# Patient Record
Sex: Female | Born: 1950
Health system: Southern US, Community
[De-identification: ages and names within clinical notes are randomized; demographics above are authoritative.]

## PROBLEM LIST (undated history)

## (undated) DIAGNOSIS — I10 Essential (primary) hypertension: Secondary | ICD-10-CM

## (undated) DIAGNOSIS — E785 Hyperlipidemia, unspecified: Secondary | ICD-10-CM

## (undated) DIAGNOSIS — E0500 Thyrotoxicosis with diffuse goiter without thyrotoxic crisis or storm: Secondary | ICD-10-CM

## (undated) HISTORY — DX: Thyrotoxicosis with diffuse goiter without thyrotoxic crisis or storm: E05.00

## (undated) HISTORY — PX: OTHER SURGICAL HISTORY: SHX169

## (undated) HISTORY — PX: BREAST BIOPSY: SHX20

## (undated) HISTORY — DX: Essential (primary) hypertension: I10

## (undated) HISTORY — DX: Hyperlipidemia, unspecified: E78.5

---

## 2000-12-30 ENCOUNTER — Encounter: Payer: Self-pay | Admitting: Internal Medicine

## 2004-02-16 ENCOUNTER — Encounter: Payer: Self-pay | Admitting: Internal Medicine

## 2004-08-04 ENCOUNTER — Ambulatory Visit: Payer: Self-pay | Admitting: Internal Medicine

## 2004-09-15 ENCOUNTER — Ambulatory Visit: Payer: Self-pay | Admitting: Internal Medicine

## 2005-02-05 ENCOUNTER — Ambulatory Visit: Payer: Self-pay | Admitting: Internal Medicine

## 2005-05-11 ENCOUNTER — Ambulatory Visit: Payer: Self-pay | Admitting: Internal Medicine

## 2005-11-23 ENCOUNTER — Ambulatory Visit: Payer: Self-pay | Admitting: Internal Medicine

## 2006-01-10 ENCOUNTER — Ambulatory Visit: Payer: Self-pay | Admitting: Gastroenterology

## 2007-03-18 ENCOUNTER — Telehealth (INDEPENDENT_AMBULATORY_CARE_PROVIDER_SITE_OTHER): Payer: Self-pay | Admitting: *Deleted

## 2007-06-09 ENCOUNTER — Telehealth (INDEPENDENT_AMBULATORY_CARE_PROVIDER_SITE_OTHER): Payer: Self-pay | Admitting: *Deleted

## 2007-11-21 ENCOUNTER — Ambulatory Visit: Payer: Self-pay | Admitting: Internal Medicine

## 2007-11-21 DIAGNOSIS — E7849 Other hyperlipidemia: Secondary | ICD-10-CM | POA: Insufficient documentation

## 2007-11-21 DIAGNOSIS — I10 Essential (primary) hypertension: Secondary | ICD-10-CM

## 2007-11-21 DIAGNOSIS — E785 Hyperlipidemia, unspecified: Secondary | ICD-10-CM

## 2007-11-26 LAB — CONVERTED CEMR LAB
Albumin: 4.2 g/dL (ref 3.5–5.2)
Basophils Absolute: 0 10*3/uL (ref 0.0–0.1)
Basophils Relative: 0.8 % (ref 0.0–3.0)
Bilirubin, Direct: 0.1 mg/dL (ref 0.0–0.3)
CO2: 30 meq/L (ref 19–32)
Chloride: 102 meq/L (ref 96–112)
Cholesterol: 209 mg/dL (ref 0–200)
Eosinophils Absolute: 0.1 10*3/uL (ref 0.0–0.7)
Eosinophils Relative: 2.2 % (ref 0.0–5.0)
GFR calc Af Amer: 95 mL/min
GFR calc non Af Amer: 79 mL/min
HCT: 39.8 % (ref 36.0–46.0)
HDL: 52.5 mg/dL (ref >39.0)
MCHC: 32.7 g/dL (ref 30.0–36.0)
MCV: 81.8 fL (ref 78.0–100.0)
Microalb Creat Ratio: 6 mg/g (ref 0.0–30.0)
Microalb, Ur: 0.2 mg/dL (ref 0.0–1.9)
Monocytes Absolute: 0.3 10*3/uL (ref 0.1–1.0)
Neutrophils Relative %: 44.4 % (ref 43.0–77.0)
Platelets: 183 10*3/uL (ref 150–400)
Potassium: 3.7 meq/L (ref 3.5–5.1)
TSH: 0.94 microintl units/mL (ref 0.35–5.50)
Total Protein: 7.3 g/dL (ref 6.0–8.3)
VLDL: 13 mg/dL (ref 0–40)
WBC: 4.5 10*3/uL (ref 4.5–10.5)

## 2007-12-25 ENCOUNTER — Ambulatory Visit: Payer: Self-pay | Admitting: Internal Medicine

## 2008-01-29 ENCOUNTER — Encounter: Payer: Self-pay | Admitting: Internal Medicine

## 2008-04-09 ENCOUNTER — Encounter: Payer: Self-pay | Admitting: Internal Medicine

## 2008-06-21 ENCOUNTER — Ambulatory Visit: Payer: Self-pay | Admitting: Internal Medicine

## 2008-08-05 ENCOUNTER — Ambulatory Visit: Payer: Self-pay | Admitting: Internal Medicine

## 2008-09-06 ENCOUNTER — Encounter: Payer: Self-pay | Admitting: Internal Medicine

## 2008-09-06 ENCOUNTER — Ambulatory Visit: Payer: Self-pay | Admitting: Internal Medicine

## 2008-09-06 LAB — HM COLONOSCOPY: HM Colonoscopy: 2

## 2008-09-12 ENCOUNTER — Encounter: Payer: Self-pay | Admitting: Internal Medicine

## 2009-07-20 ENCOUNTER — Telehealth: Payer: Self-pay | Admitting: Internal Medicine

## 2009-08-08 ENCOUNTER — Ambulatory Visit: Payer: Self-pay | Admitting: Internal Medicine

## 2009-08-08 DIAGNOSIS — E0500 Thyrotoxicosis with diffuse goiter without thyrotoxic crisis or storm: Secondary | ICD-10-CM

## 2009-08-09 LAB — CONVERTED CEMR LAB
ALT: 13 units/L (ref 0–35)
AST: 17 units/L (ref 0–37)
Alkaline Phosphatase: 37 units/L — ABNORMAL LOW (ref 39–117)
BUN: 8 mg/dL (ref 6–23)
Basophils Absolute: 0 10*3/uL (ref 0.0–0.1)
Bilirubin, Direct: 0.1 mg/dL (ref 0.0–0.3)
Calcium: 9.2 mg/dL (ref 8.4–10.5)
Creatinine, Ser: 0.8 mg/dL (ref 0.4–1.2)
Direct LDL: 146.4 mg/dL
Eosinophils Absolute: 0.1 10*3/uL (ref 0.0–0.7)
Glucose, Bld: 92 mg/dL (ref 70–99)
Hemoglobin: 13.5 g/dL (ref 12.0–15.0)
Lymphocytes Relative: 39.5 % (ref 12.0–46.0)
MCHC: 32.9 g/dL (ref 30.0–36.0)
Neutro Abs: 2.3 10*3/uL (ref 1.4–7.7)
Phosphorus: 4 mg/dL (ref 2.3–4.6)
Platelets: 200 10*3/uL (ref 150.0–400.0)
Potassium: 3.6 meq/L (ref 3.5–5.1)
RDW: 15.3 % — ABNORMAL HIGH (ref 11.5–14.6)
TSH: 0.72 microintl units/mL (ref 0.35–5.50)
Total Bilirubin: 0.4 mg/dL (ref 0.3–1.2)
Total Protein: 7.3 g/dL (ref 6.0–8.3)

## 2009-09-09 ENCOUNTER — Encounter (INDEPENDENT_AMBULATORY_CARE_PROVIDER_SITE_OTHER): Payer: Self-pay | Admitting: *Deleted

## 2010-06-06 ENCOUNTER — Ambulatory Visit
Admission: RE | Admit: 2010-06-06 | Discharge: 2010-06-06 | Payer: Self-pay | Source: Home / Self Care | Attending: Internal Medicine | Admitting: Internal Medicine

## 2010-06-06 ENCOUNTER — Other Ambulatory Visit: Payer: Self-pay | Admitting: Internal Medicine

## 2010-06-06 LAB — RENAL FUNCTION PANEL
Albumin: 3.9 g/dL (ref 3.5–5.2)
BUN: 16 mg/dL (ref 6–23)
CO2: 30 mEq/L (ref 19–32)
Chloride: 106 mEq/L (ref 96–112)

## 2010-06-06 LAB — CBC WITH DIFFERENTIAL/PLATELET
Basophils Absolute: 0 10*3/uL (ref 0.0–0.1)
Eosinophils Absolute: 0.1 10*3/uL (ref 0.0–0.7)
HCT: 39.4 % (ref 36.0–46.0)
Hemoglobin: 12.7 g/dL (ref 12.0–15.0)
Lymphs Abs: 2 10*3/uL (ref 0.7–4.0)
MCHC: 32.3 g/dL (ref 30.0–36.0)
Neutro Abs: 2.8 10*3/uL (ref 1.4–7.7)
RDW: 15.6 % — ABNORMAL HIGH (ref 11.5–14.6)

## 2010-06-06 LAB — HEPATIC FUNCTION PANEL
Alkaline Phosphatase: 36 U/L — ABNORMAL LOW (ref 39–117)
Bilirubin, Direct: 0 mg/dL (ref 0.0–0.3)
Total Protein: 7.3 g/dL (ref 6.0–8.3)

## 2010-06-06 LAB — LIPID PANEL
Total CHOL/HDL Ratio: 3
VLDL: 9.8 mg/dL (ref 0.0–40.0)

## 2010-06-06 NOTE — Assessment & Plan Note (Signed)
Summary: MED REFILL/DS   Vital Signs:  Patient profile:   60 year old female Weight:      192 pounds BMI:     31.10 Temp:     98.7 degrees F oral Pulse rate:   60 / minute Pulse rhythm:   regular BP sitting:   150 / 92  (left arm) Cuff size:   regular  Vitals Entered By: Mervin Hack CMA Duncan Dull) (August 08, 2009 10:10 AM) CC: med refill, Hypertension Management   History of Present Illness: Doing fine No new concerns  Occ checks BP in [facility] Usually 140-150/70-80 Occ headaches---usually mild and BC relieves No chest pain No SOB Occ edema---generally as the day goes on. Not bothersome  Variable with diet No sig exercise No OTC meds for cholesterol  No rash No skin changes No hair changes  Hypertension History:      Positive major cardiovascular risk factors include female age 29 years old or older, hyperlipidemia, and hypertension.  Negative major cardiovascular risk factors include non-tobacco-user status.     Allergies: No Known Drug Allergies  Past History:  Past medical, surgical, family and social histories (including risk factors) reviewed for relevance to current acute and chronic problems.  Past Medical History: Hyperlipidemia Hypertension Rudnick disease ---  Rx with RAI  Past Surgical History: Reviewed history from 11/21/2007 and no changes required. VD x 2 Hysterectomy for fibroids--1970s  Family History: Reviewed history from 11/21/2007 and no changes required. 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   Neck:  supple, no masses, no thyromegaly, no  carotid bruits, and no cervical lymphadenopathy.   Lungs:  normal respiratory effort and normal breath sounds.   Heart:  normal rate, regular rhythm, no murmur, and no gallop.   Abdomen:  soft and non-tender.   Extremities:  no sig edema Psych:  normally interactive, good eye contact, not anxious appearing, and not depressed appearing.     Impression & Recommendations:  Problem # 1:  HYPERTENSION (ICD-401.9) Assessment Unchanged  suboptimal control discussed that lifestyle measures could provide sig improvements will check labs will change to losartan for cost  The following medications were removed from the medication list:    Diovan Hct 160-12.5 Mg Tabs (Valsartan-hydrochlorothiazide) .Marland Kitchen... Take 1 tablet by mouth once a day Her updated medication list for this problem includes:    Ziac 10-6.25 Mg Tabs (Bisoprolol-hydrochlorothiazide) .Marland Kitchen... Take 1 tablet by mouth once a day    Amlodipine Besylate 5 Mg Tabs (Amlodipine besylate) .Marland Kitchen... Take 1 tablet by mouth once a day    Losartan Potassium-hctz 100-25 Mg Tabs (Losartan potassium-hctz) .Marland Kitchen... 1 tab daily for high blood pressure  BP today: 150/92 Prior BP: 148/100 (06/21/2008)  10 Yr Risk Heart Disease: 11 %  Labs Reviewed: K+: 3.7 (11/21/2007) Creat: : 0.8 (11/21/2007)   Chol: 209 (11/21/2007)   HDL: 52.5 (11/21/2007)   LDL: 143 (11/21/2007)   TG: 66 (11/21/2007)  Orders: TLB-Renal Function Panel (80069-RENAL) TLB-CBC Platelet - w/Differential (85025-CBCD) Venipuncture ([address])  Problem # 2:  Mak' DISEASE (ICD-242.00) Assessment: Unchanged  seems to be euthyroid  will check labs  Orders: TLB-TSH (Thyroid Stimulating Hormone) (84443-TSH) TLB-T4 (Thyrox), Free [phone])  Problem # 3:  HYPERLIPIDEMIA (ICD-272.4)  not overly high risk profile discussed lifestyle measures will recheck labs  Labs Reviewed: SGOT: 22 (11/21/2007)   SGPT: 18 (11/21/2007)  10 Yr Risk Heart Disease: 11 %   HDL:52.5 (11/21/2007)   LDL:143 (11/21/2007)  Chol:209 (11/21/2007)  Trig:66 (11/21/2007)  Orders: TLB-Lipid Panel (80061-LIPID) TLB-Hepatic/Liver Function Pnl (80076-HEPATIC)  Complete Medication List: 1)  Ziac 10-6.25 Mg Tabs (Bisoprolol-hydrochlorothiazide) .... Take 1 tablet by mouth once a day 2)  Amlodipine Besylate 5 Mg Tabs (Amlodipine besylate) .... Take 1 tablet by mouth once a day 3)  Losartan Potassium-hctz 100-25 Mg Tabs (Losartan potassium-hctz) .Marland Kitchen.. 1 tab daily for high blood pressure  Hypertension Assessment/Plan:      The patient's hypertensive risk group is category B: At least one risk factor (excluding diabetes) with no target organ damage.  Her calculated 10 year risk of coronary heart disease is 11 %.  Today's blood pressure is 150/92.    Patient Instructions: 1)  Please call [facility] to reschedule your mammogram 2)  Please schedule a follow-up appointment in 6 months for physical 3)  [facility]leas set up blood work (renal --401.9) in about 4-6 weeks Prescriptions: ZIAC 10-6.25 MG  TABS (BISOPROLOL-HYDROCHLOROTHIAZIDE) Take 1 tablet by mouth once a day  #30 x 11   Entered and Authorized by:   Cindee Salt MD   Signed by:   Cindee Salt MD on 08/08/2009   Method used:   [facility] to        [facility] [phone]* (retail)       [address] [place], [place]  [address]       Ph: [phone] or [phone]       Fax: [phone]   RxID:   [id number] AMLODIPINE BESYLATE 5 MG  TABS (AMLODIPINE BESYLATE) Take 1 tablet by mouth once a day  #30 x 11   Entered and Authorized by:   Cindee Salt MD   Signed by:   Cindee Salt MD on 08/08/2009   Method used:   Electronically to        [facility] [phone]* (retail)       [address] [place], [place]  [address]       Ph: [phone] or [phone]       Fax: [phone]   RxID:   [id number] LOSARTAN POTASSIUM-HCTZ 100-25 MG TABS  (LOSARTAN POTASSIUM-HCTZ) 1 tab daily for high blood pressure  #30 x 11   Entered and Authorized by:   Cindee Salt MD   Signed by:   Cindee Salt MD on 08/08/2009   Method used:   Electronically to        [facility] [phone]* (retail)       [address] [place], [place]  [address]       Ph: [phone] or [phone]       Fax: [phone]   RxID:   [id number]   Current Allergies (reviewed today): No known allergies

## 2010-06-06 NOTE — Progress Notes (Signed)
Summary: Needs appt.  Phone Note Outgoing Call   Call placed by: Mervin Hack CMA Duncan Dull),  July 20, 2009 5:12 PM Call placed to: Patient Summary of Call: called pt to advise that she needs to be seen for further refills, pt was last seen in 06/2008 and per last office note pt was to follow-up in 6 months, pt cancelled that appt. Pt made appt to be seen 08/01/2009 @ 11:15, will do refills to last until then. Initial call taken by: Mervin Hack CMA Duncan Dull),  July 20, 2009 5:14 PM  Follow-up for Phone Call        okay Follow-up by: Cindee Salt MD,  July 20, 2009 6:18 PM

## 2010-06-06 NOTE — Letter (Signed)
Summary: [place] No Show Letter  [place] at [facility]  [address] [place], [place] [address]   Phone: [phone]  Fax: [phone]    09/09/2009 MRN: [id number]  Kristine Hammond [address] [place], [place]  [address]   Dear [name],   Our records indicate that you missed your scheduled appointment with __lab___________________ on ___5.5.11_________.  Please contact this office to reschedule your appointment as soon as possible.  It is important that you keep your scheduled appointments with your physician, so we can provide you the best care possible.  Please be advised that there may be a charge for "no show" appointments.    Sincerely,   [place] at [facility]

## 2010-06-14 NOTE — Assessment & Plan Note (Signed)
Summary: CPX/DLO  r/s from 01/11/10   Vital Signs:  Patient profile:   60 year old female Weight:      196 pounds BMI:     31.75 Temp:     98.5 degrees F oral Pulse rate:   52 / minute Pulse rhythm:   regular BP sitting:   212 / 99  (left arm) Cuff size:   regular  Vitals Entered By: Mervin Hack CMA (AAMA) (June 06, 2010 9:22 AM)  Serial Vital Signs/Assessments:  Time      Position  BP       Pulse  Resp  Temp     By 9:24 AM             210/100  52                    DeShannon Smith CMA (AAMA)  CC: adult physcial   History of Present Illness: Has been doing fine  Discussed BP On unemployment and money problems Has been off her meds for about a week --due to finanacial reasons Now just filled them and is restarting---just took some on her way here Hasn't been checking BP No headaches  Feeling okay  Allergies: No Known Drug Allergies  Past History:  Past medical, surgical, family and social histories (including risk factors) reviewed for relevance to current acute and chronic problems.  Past Medical History: Reviewed history from 08/08/2009 and no changes required. Hyperlipidemia Hypertension Selvy disease ---  Rx with RAI  Past Surgical History: Reviewed history from 11/21/2007 and no changes required. VD x 2 Hysterectomy for fibroids--1970s  Family History: Reviewed history from 11/21/2007 and no changes required. Mom and Dad both died of stroke, dad @44 , mom @64  Brother with stroke also 9 siblings altogether HTN very strong in family Mat GF with bone cancer No breast or colon cancer  Social History: Reviewed history from 08/08/2009 and no changes required. 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 Eyes:  Denies double vision and vision loss-1  eye. ENT:  Denies decreased hearing and ringing in ears; teeth okay--regular with dentist. CV:  Denies chest pain or discomfort, difficulty breathing at night, difficulty breathing while lying down, fainting, lightheadness, palpitations, and shortness of breath with exertion. Resp:  Denies cough and shortness of breath. GI:  Denies abdominal pain, bloody stools, change in bowel habits, dark tarry stools, indigestion, nausea, and vomiting. GU:  Denies dysuria, incontinence, and urinary frequency; no sexual problems. MS:  Denies joint pain and joint swelling. Derm:  Denies lesion(s) and rash. Neuro:  Denies headaches, numbness, tingling, and weakness. Psych:  Denies anxiety and depression. Heme:  Denies abnormal bruising and enlarge lymph nodes. Allergy:  Denies seasonal allergies and sneezing.  Physical Exam  General:  alert and normal appearance.   Eyes:  pupils equal, pupils round, pupils reactive to light, and no optic disk abnormalities.   Ears:  R ear normal and L ear normal.   Mouth:  no erythema, no exudates, and no lesions.   Neck:  supple, no masses, no thyromegaly, no carotid bruits, and no cervical lymphadenopathy.   Breasts:  no abnormal thickening, no tenderness, and no adenopathy.  Mild cystic tissue Lungs:  normal respiratory effort, no intercostal retractions, no accessory muscle use, and normal breath sounds.   Heart:  normal rate, regular rhythm, no murmur, and no gallop.   Abdomen:  soft, non-tender, and no masses.   Msk:  no joint tenderness and no joint swelling.   Pulses:  1+ in feet Extremities:  no sig edema Neurologic:  alert & oriented X3, strength normal in all extremities, and gait normal.   Skin:  no rashes and no suspicious lesions.   Psych:  normally interactive, good eye contact, not anxious appearing, and not depressed appearing.     Impression & Recommendations:  Problem # 1:  PREVENTIVE HEALTH CARE (ICD-V70.0) Assessment Comment Only healthy but  out of shape probably not returning to work force discussed regular exercise like walkikng will check mammo  Problem # 2:  HYPERTENSION (ICD-401.9) Assessment: Deteriorated  has been off meds and only just took doses again in past hour or so will have her check as outpatient as her control had been okay no changes  Her updated medication list for this problem includes:    Ziac 10-6.25 Mg Tabs (Bisoprolol-hydrochlorothiazide) .Marland Kitchen... Take 1 tablet by mouth once a day    Amlodipine Besylate 5 Mg Tabs (Amlodipine besylate) .Marland Kitchen... Take 1 tablet by mouth once a day    Losartan Potassium-hctz 100-25 Mg Tabs (Losartan potassium-hctz) .Marland Kitchen... 1 tab daily for high blood pressure  BP today: 212/99 Prior BP: 150/92 (08/08/2009)  Prior 10 Yr Risk Heart Disease: 11 % (08/08/2009)  Labs Reviewed: K+: 3.6 (08/08/2009) Creat: : 0.8 (08/08/2009)   Chol: 216 (08/08/2009)   HDL: 60.20 (08/08/2009)   LDL: 143 (11/21/2007)   TG: 60.0 (08/08/2009)  Orders: TLB-Renal Function Panel (80069-RENAL) TLB-CBC Platelet - w/Differential (85025-CBCD) TLB-TSH (Thyroid Stimulating Hormone) (84443-TSH) Venipuncture ([address])  Problem # 3:  Baysinger' DISEASE (ICD-242.00) Assessment: Unchanged will recheck labs  Labs Reviewed: TSH: 0.72 (08/08/2009)     Problem # 4:  HYPERLIPIDEMIA (ICD-272.4) Assessment: Unchanged  mild elevation  will recheck but plan just lifestyle work for now  Labs Reviewed: SGOT: 17 (08/08/2009)   SGPT: 13 (08/08/2009)  Prior 10 Yr Risk Heart Disease: 11 % (08/08/2009)   HDL:60.20 (08/08/2009), 52.5 (11/21/2007)  LDL:143 (11/21/2007)  Chol:216 (08/08/2009), 209 (11/21/2007)  Trig:60.0 (08/08/2009), 66 (11/21/2007)  Orders: TLB-Lipid Panel (80061-LIPID) TLB-Hepatic/Liver Function Pnl (80076-HEPATIC)  Complete Medication List: 1)  Ziac 10-6.25 Mg Tabs (Bisoprolol-hydrochlorothiazide) .... Take 1 tablet by mouth once a day 2)  Amlodipine Besylate 5 Mg Tabs (Amlodipine besylate) ....  Take 1 tablet by mouth once a day 3)  Losartan Potassium-hctz 100-25 Mg Tabs (Losartan potassium-hctz) .Marland Kitchen.. 1 tab daily for high blood pressure  Other Orders: Radiology Referral (Radiology)  Patient Instructions: 1)  Please check your blood pressure over the next 1-2 weeks. Please call if still over 150/90 2)  Please schedule a follow-up appointment in 6 months .  3)  Schedule your mammogram.    Orders Added: 1)  TLB-Lipid Panel [80061-LIPID] 2)  TLB-Hepatic/Liver Function Pnl [80076-HEPATIC] 3)  TLB-Renal Function Panel [80069-RENAL] 4)  TLB-CBC Platelet - w/Differential [85025-CBCD] 5)  TLB-TSH (Thyroid Stimulating Hormone) [84443-TSH] 6)  Venipuncture [60[address]] 7)  Est. Patient 40-64 years [99[address]] 8)  Radiology Referral [Radiology]    Current Allergies (reviewed today): No known allergies

## 2010-06-27 ENCOUNTER — Telehealth: Payer: Self-pay | Admitting: Internal Medicine

## 2010-07-04 NOTE — Progress Notes (Signed)
Summary: Mammogram  ---- Converted from flag ---- ---- 06/06/2010 10:26 AM, Carlton Adam wrote: Mammogram on 06/20/2010 at 4:30 at [facility].  ---- 06/06/2010 10:01 AM, Cindee Salt MD wrote: The following orders have been entered for this patient and placed on Admin Hold:  Type:     Referral       Code:   Radiology Description:   Radiology Referral Order Date:   06/06/2010   Authorized By:   Cindee Salt MD Order #:   [id number] Clinical Notes:   Name of Test or Procedure: screening mammo Of What:  Special Instructions ------------------------------  Phone Note Outgoing Call   Call placed by: Mervin Hack CMA Duncan Dull),  June 27, 2010 8:47 AM Call placed to: Patient Summary of Call: called pt to advise she missed her mammogram appt 06/06/2010 @ 4:30 @ [facility], per pt she forgot and will call and re-schedule. Initial call taken by: Mervin Hack CMA Duncan Dull),  June 27, 2010 8:48 AM  Follow-up for Phone Call        okay Follow-up by: Cindee Salt MD,  June 27, 2010 2:03 PM

## 2010-08-24 ENCOUNTER — Other Ambulatory Visit: Payer: Self-pay | Admitting: Internal Medicine

## 2010-12-11 ENCOUNTER — Encounter: Payer: Self-pay | Admitting: Internal Medicine

## 2010-12-12 ENCOUNTER — Encounter: Payer: Self-pay | Admitting: Internal Medicine

## 2010-12-12 ENCOUNTER — Ambulatory Visit (INDEPENDENT_AMBULATORY_CARE_PROVIDER_SITE_OTHER): Payer: BC Managed Care – PPO | Admitting: Internal Medicine

## 2010-12-12 DIAGNOSIS — E0500 Thyrotoxicosis with diffuse goiter without thyrotoxic crisis or storm: Secondary | ICD-10-CM

## 2010-12-12 DIAGNOSIS — E785 Hyperlipidemia, unspecified: Secondary | ICD-10-CM

## 2010-12-12 DIAGNOSIS — I10 Essential (primary) hypertension: Secondary | ICD-10-CM

## 2010-12-12 NOTE — Assessment & Plan Note (Signed)
Discussed meds again She prefers not Discussed exercise and diet

## 2010-12-12 NOTE — Progress Notes (Signed)
  Subjective:    Patient ID: Kristine Hammond, female    DOB: 08-16-1950, 60 y.o.   MRN: [id number]  HPI Doing okay Still unemployed Still has insurance through husband Has been able to stay on her meds  Concerned about her increasing weight Doesn't do any exercise Tries to be careful with diet--discussed Weight Watchers  No chest pain No SOB No headaches  No change in skin or nails Bowels are fine  Current Outpatient Prescriptions on File Prior to Visit  Medication Sig Dispense Refill  . amLODipine (NORVASC) 5 MG tablet TAKE 1 TABLET BY MOUTH ONCE A DAY  30 tablet  11  . bisoprolol-hydrochlorothiazide (ZIAC) 10-6.25 MG per tablet TAKE 1 TABLET BY MOUTH ONCE A DAY  30 tablet  11  . losartan-hydrochlorothiazide (HYZAAR) 100-25 MG per tablet 1 TAB DAILY FOR HIGH BLOOD PRESSURE  30 tablet  11    No Known Allergies  Past Medical History  Diagnosis Date  . Hyperlipidemia   . Hypertension   . Grave's disease     treated with RAI  . Vaginal delivery      x 2    Past Surgical History  Procedure Date  . Gyn surgery     hysterectomy form fibroids    Family History  Problem Relation Age of Onset  . Stroke Mother   . Stroke Father   . Stroke Brother   . Cancer Maternal Grandfather     bone    History   Social History  . Marital Status: Married    Spouse Name: N/A    Number of Children: 2  . Years of Education: N/A   Occupational History  . Laid off from [employer], 08/2008    Social History Main Topics  . Smoking status: Former Games developer  . Smokeless tobacco: Never Used  . Alcohol Use: No  . Drug Use: Not on file  . Sexually Active: Not on file   Other Topics Concern  . Not on file   Social History Narrative  . No narrative on file   Review of Systems Appetite is fine Mild depression but nothing serious Sleeps fine     Objective:   Physical Exam  Constitutional: She appears well-developed and well-nourished. No distress.  Neck:  Normal range of motion. Neck supple. No thyromegaly present.  Cardiovascular: Normal rate, regular rhythm, normal heart sounds and intact distal pulses.  Exam reveals no gallop.   No murmur heard. Pulmonary/Chest: Effort normal and breath sounds normal. No respiratory distress. She has no wheezes. She has no rales.  Musculoskeletal: Normal range of motion. She exhibits no edema and no tenderness.       Ankles puffy but no edema  Lymphadenopathy:    She has no cervical adenopathy.  Skin: No rash noted.  Psychiatric: She has a normal mood and affect. Her behavior is normal. Judgment and thought content normal.          Assessment & Plan:

## 2010-12-12 NOTE — Patient Instructions (Signed)
Please try to walk or do some other exercise daily Please restart Weight Watchers

## 2010-12-12 NOTE — Assessment & Plan Note (Signed)
BP Readings from Last 3 Encounters:  12/12/10 140/90  06/06/10 212/99  08/08/09 150/92   Control is okay again back on meds No changes  Labs next visit

## 2010-12-12 NOTE — Assessment & Plan Note (Signed)
Seems to be euthyroid Labs next time 

## 2011-06-12 ENCOUNTER — Encounter: Payer: Self-pay | Admitting: Internal Medicine

## 2011-06-12 ENCOUNTER — Ambulatory Visit (INDEPENDENT_AMBULATORY_CARE_PROVIDER_SITE_OTHER): Payer: BC Managed Care – PPO | Admitting: Internal Medicine

## 2011-06-12 VITALS — BP 168/100 | HR 60 | Temp 97.9°F | Ht 67.0 in | Wt 202.0 lb

## 2011-06-12 DIAGNOSIS — E0500 Thyrotoxicosis with diffuse goiter without thyrotoxic crisis or storm: Secondary | ICD-10-CM

## 2011-06-12 DIAGNOSIS — E669 Obesity, unspecified: Secondary | ICD-10-CM

## 2011-06-12 DIAGNOSIS — Z1231 Encounter for screening mammogram for malignant neoplasm of breast: Secondary | ICD-10-CM

## 2011-06-12 DIAGNOSIS — I10 Essential (primary) hypertension: Secondary | ICD-10-CM

## 2011-06-12 DIAGNOSIS — Z2911 Encounter for prophylactic immunotherapy for respiratory syncytial virus (RSV): Secondary | ICD-10-CM

## 2011-06-12 DIAGNOSIS — E785 Hyperlipidemia, unspecified: Secondary | ICD-10-CM

## 2011-06-12 DIAGNOSIS — Z23 Encounter for immunization: Secondary | ICD-10-CM

## 2011-06-12 DIAGNOSIS — Z0000 Encounter for general adult medical examination without abnormal findings: Secondary | ICD-10-CM

## 2011-06-12 LAB — LIPID PANEL
HDL: 57.2 mg/dL (ref >39.00)
Total CHOL/HDL Ratio: 4
VLDL: 16 mg/dL (ref 0.0–40.0)

## 2011-06-12 LAB — HEPATIC FUNCTION PANEL
AST: 19 U/L (ref 0–37)
Alkaline Phosphatase: 44 U/L (ref 39–117)
Total Bilirubin: 0.5 mg/dL (ref 0.3–1.2)

## 2011-06-12 LAB — CBC WITH DIFFERENTIAL/PLATELET
Basophils Absolute: 0 10*3/uL (ref 0.0–0.1)
Basophils Relative: 0.5 % (ref 0.0–3.0)
Eosinophils Absolute: 0.1 10*3/uL (ref 0.0–0.7)
Lymphocytes Relative: 34.1 % (ref 12.0–46.0)
MCHC: 32.7 g/dL (ref 30.0–36.0)
Monocytes Relative: 6.4 % (ref 3.0–12.0)
Neutrophils Relative %: 57.4 % (ref 43.0–77.0)
RBC: 5.13 Mil/uL — ABNORMAL HIGH (ref 3.87–5.11)
WBC: 6.1 10*3/uL (ref 4.5–10.5)

## 2011-06-12 LAB — BASIC METABOLIC PANEL
BUN: 11 mg/dL (ref 6–23)
Chloride: 104 mEq/L (ref 96–112)
Potassium: 3.9 mEq/L (ref 3.5–5.1)

## 2011-06-12 LAB — TSH: TSH: 1.2 u[IU]/mL (ref 0.35–5.50)

## 2011-06-12 LAB — T4, FREE: Free T4: 0.95 ng/dL (ref 0.60–1.60)

## 2011-06-12 NOTE — Assessment & Plan Note (Signed)
BP Readings from Last 3 Encounters:  06/12/11 168/100  12/12/10 140/90  06/06/10 212/99   Repeat by me 204/100 on the right Did miss meds today and had bad night Will check urine microal and recheck in 2 weeks

## 2011-06-12 NOTE — Assessment & Plan Note (Signed)
Due for labs Seems euthyroid 

## 2011-06-12 NOTE — Assessment & Plan Note (Signed)
Needs dietary regimen

## 2011-06-12 NOTE — Patient Instructions (Addendum)
I recommend you try weight watchers for proper eating Try to walk or do other exercise every morning Please set up your mammogram

## 2011-06-12 NOTE — Assessment & Plan Note (Signed)
Lab Results  Component Value Date   LDLCALC 143* 11/21/2007   She prefers no meds Discussed fitness efforts and weight loss

## 2011-06-12 NOTE — Assessment & Plan Note (Signed)
Due for mammo Will accept zostavax but not flu Discussed fitness

## 2011-06-12 NOTE — Progress Notes (Signed)
Subjective:    Patient ID: Kristine Hammond, female    DOB: 12-Apr-1951, 61 y.o.   MRN: [id number]  HPI Doing okay Still unemployed  Had a bad night last night Bad news and couldn't sleep Some headache this AM  Never got mammo Will reschedule Agrees to zostavax but not flu shot  Current Outpatient Prescriptions on File Prior to Visit  Medication Sig Dispense Refill  . amLODipine (NORVASC) 5 MG tablet TAKE 1 TABLET BY MOUTH ONCE A DAY  30 tablet  11  . bisoprolol-hydrochlorothiazide (ZIAC) 10-6.25 MG per tablet TAKE 1 TABLET BY MOUTH ONCE A DAY  30 tablet  11  . losartan-hydrochlorothiazide (HYZAAR) 100-25 MG per tablet 1 TAB DAILY FOR HIGH BLOOD PRESSURE  30 tablet  11    No Known Allergies  Past Medical History  Diagnosis Date  . Hyperlipidemia   . Hypertension   . Grave's disease     treated with RAI  . Vaginal delivery      x 2    Past Surgical History  Procedure Date  . Gyn surgery     hysterectomy form fibroids    Family History  Problem Relation Age of Onset  . Stroke Mother   . Stroke Father   . Stroke Brother   . Cancer Maternal Grandfather     bone    History   Social History  . Marital Status: Married    Spouse Name: N/A    Number of Children: 2  . Years of Education: N/A   Occupational History  . Laid off from [employer], 08/2008    Social History Main Topics  . Smoking status: Former Games developer  . Smokeless tobacco: Never Used  . Alcohol Use: No  . Drug Use: Not on file  . Sexually Active: Not on file   Other Topics Concern  . Not on file   Social History Narrative  . No narrative on file   Review of Systems  Constitutional: Positive for unexpected weight change. Negative for fatigue.       Weight up a few pounds Wears seat belt  HENT: Negative for hearing loss, congestion, rhinorrhea, dental problem and tinnitus.        Regular with dentist  Eyes: Negative for visual disturbance.       No diplopia or unilateral  vision loss  Respiratory: Negative for cough, chest tightness and shortness of breath.   Cardiovascular: Positive for leg swelling. Negative for chest pain and palpitations.       Occ brief periods of ankle swelling  Gastrointestinal: Negative for nausea, vomiting, abdominal pain, constipation and blood in stool.       No heartburn  Genitourinary: Negative for dysuria, difficulty urinating and dyspareunia.       Occ mild stress urinary incontinence  Musculoskeletal: Negative for back pain, joint swelling and arthralgias.  Neurological: Positive for headaches. Negative for dizziness, syncope, weakness, light-headedness and numbness.       Rare headaches only  Hematological: Negative for adenopathy. Does not bruise/bleed easily.  Psychiatric/Behavioral: Negative for sleep disturbance and dysphoric mood. The patient is not nervous/anxious.        Objective:   Physical Exam  Constitutional: She is oriented to person, place, and time. She appears well-developed and well-nourished. No distress.  HENT:  Head: Normocephalic and atraumatic.  Right Ear: External ear normal.  Left Ear: External ear normal.  Mouth/Throat: Oropharynx is clear and moist. No oropharyngeal exudate.  TMs normal  Eyes: Conjunctivae and EOM are normal. Pupils are equal, round, and reactive to light.  Neck: Normal range of motion. Neck supple. No thyromegaly present.  Cardiovascular: Normal rate, regular rhythm, normal heart sounds and intact distal pulses.  Exam reveals no gallop.   No murmur heard. Pulmonary/Chest: Effort normal and breath sounds normal. No respiratory distress. She has no wheezes. She has no rales.  Abdominal: Soft. There is no tenderness.  Genitourinary:       Bilateral cystic changes in breasts  Musculoskeletal: She exhibits no edema and no tenderness.  Lymphadenopathy:    She has no cervical adenopathy.    She has no axillary adenopathy.  Neurological: She is alert and oriented to person,  place, and time.  Skin: No rash noted. No erythema.  Psychiatric: She has a normal mood and affect. Her behavior is normal. Judgment and thought content normal.          Assessment & Plan:

## 2011-06-13 LAB — MICROALBUMIN / CREATININE URINE RATIO: Microalb, Ur: 1.2 mg/dL (ref 0.0–1.9)

## 2011-06-25 ENCOUNTER — Ambulatory Visit: Payer: BC Managed Care – PPO | Admitting: Internal Medicine

## 2011-07-09 ENCOUNTER — Ambulatory Visit: Payer: BC Managed Care – PPO | Admitting: Internal Medicine

## 2011-07-09 DIAGNOSIS — Z0289 Encounter for other administrative examinations: Secondary | ICD-10-CM

## 2011-09-26 ENCOUNTER — Other Ambulatory Visit: Payer: Self-pay | Admitting: Internal Medicine

## 2011-10-31 ENCOUNTER — Other Ambulatory Visit: Payer: Self-pay | Admitting: *Deleted

## 2011-10-31 MED ORDER — BISOPROLOL-HYDROCHLOROTHIAZIDE 10-6.25 MG PO TABS: 1.0000 | ORAL_TABLET | Freq: Every day | ORAL | Status: DC

## 2011-10-31 MED ORDER — LOSARTAN POTASSIUM-HCTZ 100-25 MG PO TABS: 1.0000 | ORAL_TABLET | Freq: Every day | ORAL | Status: DC

## 2011-10-31 MED ORDER — AMLODIPINE BESYLATE 5 MG PO TABS: 5.0000 mg | ORAL_TABLET | Freq: Every day | ORAL | Status: DC

## 2012-05-07 HISTORY — PX: BREAST SURGERY: SHX581

## 2012-11-10 ENCOUNTER — Other Ambulatory Visit: Payer: Self-pay | Admitting: Internal Medicine

## 2012-11-10 NOTE — Telephone Encounter (Signed)
Pt needs an appt

## 2012-11-11 ENCOUNTER — Other Ambulatory Visit: Payer: Self-pay

## 2012-11-11 MED ORDER — LOSARTAN POTASSIUM-HCTZ 100-25 MG PO TABS: 1.0000 | ORAL_TABLET | Freq: Every day | ORAL | Status: DC

## 2012-11-11 MED ORDER — BISOPROLOL-HYDROCHLOROTHIAZIDE 10-6.25 MG PO TABS: 1.0000 | ORAL_TABLET | Freq: Every day | ORAL | Status: DC

## 2012-11-11 MED ORDER — AMLODIPINE BESYLATE 5 MG PO TABS: 5.0000 mg | ORAL_TABLET | Freq: Every day | ORAL | Status: DC

## 2012-11-11 NOTE — Telephone Encounter (Signed)
Pt request refill amlodipine, bisoprolol hctz, losartan hctz [facility] [address] for 90 day supply because more affordable and pt will keep appt already scheduled for 11/13/12 at 2:45 pm. Pt said slightly dizzy today and thinks needs meds. Advised refilled to [facility]. Pt will call back if condition changes or worsens.

## 2012-11-13 ENCOUNTER — Ambulatory Visit: Payer: BC Managed Care – PPO | Admitting: Internal Medicine

## 2012-11-13 DIAGNOSIS — Z0289 Encounter for other administrative examinations: Secondary | ICD-10-CM

## 2013-01-29 ENCOUNTER — Ambulatory Visit: Payer: Self-pay | Admitting: Internal Medicine

## 2013-01-30 ENCOUNTER — Encounter: Payer: Self-pay | Admitting: Internal Medicine

## 2013-02-02 ENCOUNTER — Other Ambulatory Visit: Payer: Self-pay | Admitting: Internal Medicine

## 2013-02-03 ENCOUNTER — Other Ambulatory Visit: Payer: Self-pay | Admitting: Internal Medicine

## 2013-02-07 ENCOUNTER — Other Ambulatory Visit: Payer: Self-pay | Admitting: Internal Medicine

## 2013-02-09 NOTE — Telephone Encounter (Signed)
rx sent to pharmacy by e-script  

## 2013-02-09 NOTE — Telephone Encounter (Signed)
Okay to fill only 30 days till she shows up

## 2013-02-09 NOTE — Telephone Encounter (Signed)
Pt has either no-showed or canceled her last 3 appointments, she was last seen 06/2011. Pt has an appt on 02/20/2013 ok to fill 30 day refill? Pt was requesting supply. Please advise

## 2013-02-18 ENCOUNTER — Ambulatory Visit: Payer: Self-pay | Admitting: Internal Medicine

## 2013-02-18 ENCOUNTER — Encounter: Payer: Self-pay | Admitting: Internal Medicine

## 2013-02-18 ENCOUNTER — Telehealth: Payer: Self-pay | Admitting: Internal Medicine

## 2013-02-18 NOTE — Telephone Encounter (Signed)
[facility] called to ask Dr Alphonsus Sias if it was ok to proceed with a Stereotactic Biopsy of the Right Breast after doing followup studies today. Dr Alphonsus Sias said yes to their request.

## 2013-02-20 ENCOUNTER — Ambulatory Visit (INDEPENDENT_AMBULATORY_CARE_PROVIDER_SITE_OTHER): Payer: BC Managed Care – PPO | Admitting: Internal Medicine

## 2013-02-20 ENCOUNTER — Encounter: Payer: Self-pay | Admitting: Internal Medicine

## 2013-02-20 VITALS — BP 180/100 | HR 56 | Temp 98.6°F | Ht 67.0 in | Wt 202.0 lb

## 2013-02-20 DIAGNOSIS — E785 Hyperlipidemia, unspecified: Secondary | ICD-10-CM

## 2013-02-20 DIAGNOSIS — I10 Essential (primary) hypertension: Secondary | ICD-10-CM

## 2013-02-20 DIAGNOSIS — Z0000 Encounter for general adult medical examination without abnormal findings: Secondary | ICD-10-CM

## 2013-02-20 DIAGNOSIS — R928 Other abnormal and inconclusive findings on diagnostic imaging of breast: Secondary | ICD-10-CM

## 2013-02-20 DIAGNOSIS — E669 Obesity, unspecified: Secondary | ICD-10-CM

## 2013-02-20 DIAGNOSIS — E0500 Thyrotoxicosis with diffuse goiter without thyrotoxic crisis or storm: Secondary | ICD-10-CM

## 2013-02-20 LAB — BASIC METABOLIC PANEL
BUN: 16 mg/dL (ref 6–23)
Calcium: 9.6 mg/dL (ref 8.4–10.5)
Creatinine, Ser: 0.9 mg/dL (ref 0.4–1.2)
GFR: 82.64 mL/min (ref >60.00)
Glucose, Bld: 99 mg/dL (ref 70–99)

## 2013-02-20 LAB — CBC WITH DIFFERENTIAL/PLATELET
Basophils Absolute: 0 10*3/uL (ref 0.0–0.1)
Eosinophils Absolute: 0.1 10*3/uL (ref 0.0–0.7)
HCT: 43.1 % (ref 36.0–46.0)
Hemoglobin: 13.8 g/dL (ref 12.0–15.0)
Lymphocytes Relative: 23.8 % (ref 12.0–46.0)
Lymphs Abs: 1.7 10*3/uL (ref 0.7–4.0)
MCHC: 32 g/dL (ref 30.0–36.0)
MCV: 81 fl (ref 78.0–100.0)
Monocytes Relative: 5.5 % (ref 3.0–12.0)
Platelets: 213 10*3/uL (ref 150.0–400.0)
RDW: 16.3 % — ABNORMAL HIGH (ref 11.5–14.6)

## 2013-02-20 LAB — LIPID PANEL
Cholesterol: 210 mg/dL — ABNORMAL HIGH (ref 0–200)
Total CHOL/HDL Ratio: 4
Triglycerides: 71 mg/dL (ref 0.0–149.0)
VLDL: 14.2 mg/dL (ref 0.0–40.0)

## 2013-02-20 LAB — TSH: TSH: 0.91 u[IU]/mL (ref 0.35–5.50)

## 2013-02-20 LAB — MICROALBUMIN / CREATININE URINE RATIO
Creatinine,U: 80.9 mg/dL
Microalb, Ur: 0.5 mg/dL (ref 0.0–1.9)

## 2013-02-20 LAB — HEPATIC FUNCTION PANEL
Albumin: 4.1 g/dL (ref 3.5–5.2)
Alkaline Phosphatase: 44 U/L (ref 39–117)

## 2013-02-20 LAB — T4, FREE: Free T4: 0.88 ng/dL (ref 0.60–1.60)

## 2013-02-20 MED ORDER — AMLODIPINE BESYLATE 10 MG PO TABS: 10.0000 mg | ORAL_TABLET | Freq: Every day | ORAL | Status: DC

## 2013-02-20 MED ORDER — LOSARTAN POTASSIUM-HCTZ 100-25 MG PO TABS: 1.0000 | ORAL_TABLET | Freq: Every day | ORAL | Status: DC

## 2013-02-20 MED ORDER — AMLODIPINE BESYLATE 5 MG PO TABS: 5.0000 mg | ORAL_TABLET | Freq: Every day | ORAL | Status: DC

## 2013-02-20 MED ORDER — BISOPROLOL-HYDROCHLOROTHIAZIDE 10-6.25 MG PO TABS: 1.0000 | ORAL_TABLET | Freq: Every day | ORAL | Status: DC

## 2013-02-20 NOTE — Assessment & Plan Note (Signed)
Discussed DASH diet and exercise

## 2013-02-20 NOTE — Assessment & Plan Note (Signed)
Discussed primary prevention--she is hesitant to use statin due to troubles her brother had Will start ASA though

## 2013-02-20 NOTE — Assessment & Plan Note (Signed)
BP Readings from Last 3 Encounters:  02/20/13 180/100  06/12/11 168/100  12/12/10 140/90   Repeat 184/96 on right Persistent elevation so will increase her amlodipine Recheck 6 weeks

## 2013-02-20 NOTE — Progress Notes (Signed)
Subjective:    Patient ID: Kristine Hammond, female    DOB: 09/02/1950, 62 y.o.   MRN: [id number]  HPI Here for physical Got concerning mammogram----has biopsy due on Monday  Doesn't check BP Hasn't missed meds  Still not working Husband on disability, have rental properties They both have SSI Stays busy as Radiographer, therapeutic  No other new concerns  No current outpatient prescriptions on file prior to visit.   No current facility-administered medications on file prior to visit.    No Known Allergies  Past Medical History  Diagnosis Date  . Hyperlipidemia   . Hypertension   . Grave's disease     treated with RAI  . Vaginal delivery      x 2    Past Surgical History  Procedure Laterality Date  . Gyn surgery      hysterectomy form fibroids    Family History  Problem Relation Age of Onset  . Stroke Mother   . Stroke Father   . Stroke Brother   . Cancer Maternal Grandfather     bone    History   Social History  . Marital Status: Married    Spouse Name: N/A    Number of Children: 2  . Years of Education: N/A   Occupational History  . Laid off from [employer], 08/2008    Social History Main Topics  . Smoking status: Former Games developer  . Smokeless tobacco: Never Used  . Alcohol Use: No  . Drug Use: Not on file  . Sexual Activity: Not on file   Other Topics Concern  . Not on file   Social History Narrative  . No narrative on file   Review of Systems  Constitutional: Negative for fatigue and unexpected weight change.       Not really working on fitness Wears seat belt  HENT: Negative for congestion, dental problem, hearing loss, rhinorrhea and tinnitus.        Regular with dentist  Eyes: Negative for visual disturbance.       No diplopia or unilateral vision loss  Respiratory: Positive for cough. Negative for chest tightness and shortness of breath.        Sporadic cough  Cardiovascular: Negative for chest pain, palpitations and leg  swelling.  Gastrointestinal: Negative for nausea, vomiting, abdominal pain, constipation and blood in stool.       No heartburn  Endocrine: Negative for cold intolerance and heat intolerance.  Genitourinary:       Mild stress incontinence No sexual problems  Musculoskeletal: Negative for arthralgias, back pain and joint swelling.  Skin: Negative for rash.       No suspicious lesions  Allergic/Immunologic: Negative for environmental allergies and immunocompromised state.  Neurological: Negative for dizziness, syncope, weakness, light-headedness, numbness and headaches.  Hematological: Negative for adenopathy. Does not bruise/bleed easily.  Psychiatric/Behavioral: Negative for sleep disturbance and dysphoric mood. The patient is not nervous/anxious.        Objective:   Physical Exam  Constitutional: She is oriented to person, place, and time. She appears well-developed and well-nourished. No distress.  HENT:  Head: Normocephalic and atraumatic.  Right Ear: External ear normal.  Left Ear: External ear normal.  Mouth/Throat: Oropharynx is clear and moist. No oropharyngeal exudate.  Eyes: Conjunctivae and EOM are normal. Pupils are equal, round, and reactive to light.  Neck: Normal range of motion. Neck supple. No thyromegaly present.  Cardiovascular: Normal rate, regular rhythm, normal heart sounds and intact distal pulses.  Exam reveals no gallop.   No murmur heard. Pulmonary/Chest: Effort normal and breath sounds normal. No respiratory distress. She has no wheezes. She has no rales.  Abdominal: Soft. There is no tenderness.  Genitourinary:  Dense periareolar cystic tissue on right---no specific worrisome mass  Musculoskeletal: She exhibits no edema and no tenderness.  Lymphadenopathy:    She has no cervical adenopathy.    She has no axillary adenopathy.  Neurological: She is alert and oriented to person, place, and time.  Skin: No rash noted. No erythema.  Psychiatric: She has a  normal mood and affect. Her behavior is normal.          Assessment & Plan:

## 2013-02-20 NOTE — Patient Instructions (Addendum)
Please start an 81mg  aspirin daily after your biopsy on Monday. Increase the amlodipine to 10mg  daily  DASH Diet The DASH diet stands for "Dietary Approaches to Stop Hypertension." It is a healthy eating plan that has been shown to reduce high blood pressure (hypertension) in as little as 14 days, while also possibly providing other significant health benefits. These other health benefits include reducing the risk of breast cancer after menopause and reducing the risk of type 2 diabetes, heart disease, colon cancer, and stroke. Health benefits also include weight loss and slowing kidney failure in patients with chronic kidney disease.  DIET GUIDELINES  Limit salt (sodium). Your diet should contain less than 1500 mg of sodium daily.  Limit refined or processed carbohydrates. Your diet should include mostly whole grains. Desserts and added sugars should be used sparingly.  Include small amounts of heart-healthy fats. These types of fats include nuts, oils, and tub margarine. Limit saturated and trans fats. These fats have been shown to be harmful in the body. CHOOSING FOODS  The following food groups are based on a 2000 calorie diet. See your Registered Dietitian for individual calorie needs. Grains and Grain Products (6 to 8 servings daily)  Eat More Often: Whole-wheat bread, brown rice, whole-grain or wheat pasta, quinoa, popcorn without added fat or salt (air popped).  Eat Less Often: White bread, white pasta, white rice, cornbread. Vegetables (4 to 5 servings daily)  Eat More Often: Fresh, frozen, and canned vegetables. Vegetables may be raw, steamed, roasted, or grilled with a minimal amount of fat.  Eat Less Often/Avoid: Creamed or fried vegetables. Vegetables in a cheese sauce. Fruit (4 to 5 servings daily)  Eat More Often: All fresh, canned (in natural juice), or frozen fruits. Dried fruits without added sugar. One hundred percent fruit juice ( cup [237 mL] daily).  Eat Less Often:  Dried fruits with added sugar. Canned fruit in light or heavy syrup. [employer], Fish, and Poultry (2 servings or less daily. One serving is 3 to 4 oz [85-114 g]).  Eat More Often: Ninety percent or leaner ground beef, tenderloin, sirloin. Round cuts of beef, chicken breast, [place] breast. All fish. Grill, bake, or broil your meat. Nothing should be fried.  Eat Less Often/Avoid: Fatty cuts of meat, [place], or chicken leg, thigh, or wing. Fried cuts of meat or fish. Dairy (2 to 3 servings)  Eat More Often: Low-fat or fat-free milk, low-fat plain or light yogurt, reduced-fat or part-skim cheese.  Eat Less Often/Avoid: Milk (whole, 2%).Whole milk yogurt. Full-fat cheeses. Nuts, Seeds, and Legumes (4 to 5 servings per week)  Eat More Often: All without added salt.  Eat Less Often/Avoid: Salted nuts and seeds, canned beans with added salt. Fats and Sweets (limited)  Eat More Often: Vegetable oils, tub margarines without trans fats, sugar-free gelatin. Mayonnaise and salad dressings.  Eat Less Often/Avoid: Coconut oils, palm oils, butter, stick margarine, cream, half and half, cookies, candy, pie. FOR MORE INFORMATION The Dash Diet Eating Plan: www.dashdiet.org Document Released: 04/12/2011 Document Revised: 07/16/2011 Document Reviewed: 04/12/2011 [facility] Patient Information 2014 [place], [place].

## 2013-02-20 NOTE — Assessment & Plan Note (Signed)
UTD with colon Prefers no pap or flu shot Getting stereotactic biopsy on right breast 10/20 due to mammo finding

## 2013-02-23 ENCOUNTER — Encounter: Payer: Self-pay | Admitting: *Deleted

## 2013-03-23 ENCOUNTER — Ambulatory Visit: Payer: Self-pay | Admitting: Internal Medicine

## 2013-03-24 LAB — PATHOLOGY REPORT

## 2013-03-25 ENCOUNTER — Telehealth: Payer: Self-pay | Admitting: Internal Medicine

## 2013-03-25 DIAGNOSIS — R897 Abnormal histological findings in specimens from other organs, systems and tissues: Secondary | ICD-10-CM

## 2013-03-25 NOTE — Telephone Encounter (Signed)
Patient will see Dr Evette Cristal tomm on 03/26/13 and patient is aware.

## 2013-03-25 NOTE — Telephone Encounter (Signed)
Phone call from Dr Si Gaul Did the needle biopsy on the suspicious calcifications Came out "sclerosing lesion with calcifications" Recommendation is for surgical excision  Discussed with patient Will set up with Byrnett/Sankar

## 2013-03-26 ENCOUNTER — Encounter: Payer: Self-pay | Admitting: General Surgery

## 2013-03-26 ENCOUNTER — Other Ambulatory Visit: Payer: BC Managed Care – PPO

## 2013-03-26 ENCOUNTER — Ambulatory Visit (INDEPENDENT_AMBULATORY_CARE_PROVIDER_SITE_OTHER): Payer: BC Managed Care – PPO | Admitting: General Surgery

## 2013-03-26 VITALS — BP 130/72 | HR 72 | Resp 14 | Ht 66.0 in | Wt 203.0 lb

## 2013-03-26 DIAGNOSIS — D4860 Neoplasm of uncertain behavior of unspecified breast: Secondary | ICD-10-CM

## 2013-03-26 DIAGNOSIS — D4861 Neoplasm of uncertain behavior of right breast: Secondary | ICD-10-CM

## 2013-03-26 NOTE — Addendum Note (Signed)
Addended by: Kieth Brightly on: 03/26/2013 02:46 PM   Modules accepted: Orders

## 2013-03-26 NOTE — Patient Instructions (Addendum)
Patient to be scheduled for excision of right breast lesion-lumpectomy This patient's surgery has been scheduled for 03-31-13 at [facility]. It is okay for patient to continue 81 mg aspirin.

## 2013-03-26 NOTE — Progress Notes (Signed)
Patient ID: Kristine Hammond, female   DOB: Dec 21, 1950, 62 y.o.   MRN: [id number]  Chief Complaint  Patient presents with  . Other    mammogram    HPI Kristine Hammond is a 62 y.o. female who presents for a breast evaluation. The most recent mammogram was done on 02/18/13 with a birad category 4. The patient underwent a stereotactic breast biopsy of the right breast on 03/23/13. Patient does perform regular self breast checks and gets regular mammograms done. The patient denies any problems with the breast at this time.    HPI  Past Medical History  Diagnosis Date  . Hyperlipidemia   . Hypertension   . Grave's disease     treated with RAI  . Vaginal delivery      x 2    Past Surgical History  Procedure Laterality Date  . Gyn surgery      hysterectomy form fibroids  . Breast surgery Right 2014    rt breast biopsy    Family History  Problem Relation Age of Onset  . Stroke Mother   . Stroke Father   . Stroke Brother   . Cancer Maternal Grandfather     bone    Social History History  Substance Use Topics  . Smoking status: Former Games developer  . Smokeless tobacco: Never Used  . Alcohol Use: No    No Known Allergies  Current Outpatient Prescriptions  Medication Sig Dispense Refill  . amLODipine (NORVASC) 10 MG tablet Take 1 tablet (10 mg total) by mouth daily.  90 tablet  3  . aspirin EC 81 MG tablet Take 81 mg by mouth daily.      . bisoprolol-hydrochlorothiazide (ZIAC) 10-6.25 MG per tablet Take 1 tablet by mouth daily.  90 tablet  3  . losartan-hydrochlorothiazide (HYZAAR) 100-25 MG per tablet Take 1 tablet by mouth daily.  90 tablet  3   No current facility-administered medications for this visit.    Review of Systems Review of Systems  Constitutional: Negative.   Respiratory: Negative.   Cardiovascular: Negative.     Blood pressure 130/72, pulse 72, resp. rate 14, height 5\' 6"  (1.676 m), weight 203 lb (92.08 kg).  Physical Exam Physical Exam   Constitutional: She is oriented to person, place, and time. She appears well-developed and well-nourished.  Eyes: Conjunctivae are normal. No scleral icterus.  Neck: Neck supple. No thyromegaly present.  Cardiovascular: Normal rate, regular rhythm and normal heart sounds.   No murmur heard. Pulmonary/Chest: Effort normal and breath sounds normal. Right breast exhibits no inverted nipple, no mass, no nipple discharge, no skin change and no tenderness. Left breast exhibits no inverted nipple, no mass, no nipple discharge, no skin change and no tenderness.  Biopsy site near areola in uoq right shows no hematoma, seroma or infection.  Lymphadenopathy:    She has no cervical adenopathy.    She has no axillary adenopathy.  Neurological: She is alert and oriented to person, place, and time.  Skin: Skin is warm and dry.    Data Reviewed  Mammogram and Stereotactic breast biopsy reviewed. Path showed a complex sclerosing lesion. This does require excision. Reasons , procedure explained to pt and she is agreeable.   Assessment    Exam Stable. Ultrasound of the right breast was performed at today's visit- biopsy cavity and clip are identified.     Plan    Patient to be scheduled for an excision of a right breast lesion.  This patient's surgery has been scheduled for 03-31-13 at [facility]. It is okay for patient to continue 81 mg aspirin.   Jaylon Grode G 03/26/2013, 2:40 PM

## 2013-03-31 ENCOUNTER — Encounter: Payer: Self-pay | Admitting: General Surgery

## 2013-03-31 ENCOUNTER — Ambulatory Visit: Payer: Self-pay | Admitting: General Surgery

## 2013-03-31 DIAGNOSIS — D4860 Neoplasm of uncertain behavior of unspecified breast: Secondary | ICD-10-CM

## 2013-04-06 ENCOUNTER — Encounter: Payer: Self-pay | Admitting: General Surgery

## 2013-04-07 ENCOUNTER — Telehealth: Payer: Self-pay | Admitting: *Deleted

## 2013-04-07 NOTE — Telephone Encounter (Signed)
Notified patient as instructed, patient pleased. Discussed follow-up appointments, patient agrees  

## 2013-04-07 NOTE — Telephone Encounter (Signed)
Message copied by Currie Paris on Tue Apr 07, 2013  8:32 AM ------      Message from: Kieth Brightly      Created: Tue Apr 07, 2013  8:14 AM       Please let pt pt know the pathology was normal. ------

## 2013-04-07 NOTE — Telephone Encounter (Signed)
Message copied by Currie Paris on Tue Apr 07, 2013  8:20 AM ------      Message from: Kieth Brightly      Created: Tue Apr 07, 2013  8:14 AM       Please let pt pt know the pathology was normal. ------

## 2013-04-09 ENCOUNTER — Ambulatory Visit (INDEPENDENT_AMBULATORY_CARE_PROVIDER_SITE_OTHER): Payer: BC Managed Care – PPO | Admitting: General Surgery

## 2013-04-09 ENCOUNTER — Encounter: Payer: Self-pay | Admitting: General Surgery

## 2013-04-09 VITALS — BP 146/94 | HR 72 | Resp 12 | Ht 66.0 in | Wt 202.0 lb

## 2013-04-09 DIAGNOSIS — D4860 Neoplasm of uncertain behavior of unspecified breast: Secondary | ICD-10-CM

## 2013-04-09 DIAGNOSIS — D4861 Neoplasm of uncertain behavior of right breast: Secondary | ICD-10-CM

## 2013-04-09 NOTE — Progress Notes (Signed)
This is a 62 year old female here today for her post op right breast lumpectomy done on 03/31/13. Patient states she is doing well.  Right breast lumpectomy site is healing well. No sign of infection. Pathology report- complex sclerosing lesion, no atypia or other suspicious findings.  Patient to return in 11 months for bilateral screening mammogram.

## 2013-04-09 NOTE — Patient Instructions (Addendum)
Patient to return in 11 months with bilateral screening mammogram. Patient to continue monthly self breast exams.

## 2013-04-10 ENCOUNTER — Ambulatory Visit (INDEPENDENT_AMBULATORY_CARE_PROVIDER_SITE_OTHER): Payer: BC Managed Care – PPO | Admitting: Internal Medicine

## 2013-04-10 ENCOUNTER — Encounter: Payer: Self-pay | Admitting: Internal Medicine

## 2013-04-10 VITALS — BP 136/84 | HR 62 | Temp 98.2°F | Wt 204.5 lb

## 2013-04-10 DIAGNOSIS — I10 Essential (primary) hypertension: Secondary | ICD-10-CM

## 2013-04-10 NOTE — Progress Notes (Signed)
Pre-visit discussion using our clinic review tool. No additional management support is needed unless otherwise documented below in the visit note.  

## 2013-04-10 NOTE — Assessment & Plan Note (Signed)
BP Readings from Last 3 Encounters:  04/10/13 136/84  04/09/13 146/94  03/26/13 130/72   Now back under good control No changes needed

## 2013-04-10 NOTE — Progress Notes (Signed)
   Subjective:    Patient ID: Kristine Hammond, female    DOB: 1951/01/14, 62 y.o.   MRN: [id number]  HPI Breast biopsy was negative--- Eugenie Birks!  Feels good No headaches No dizziness No chest pain or SOB Still hasn't started exercise No edema  Current Outpatient Prescriptions on File Prior to Visit  Medication Sig Dispense Refill  . amLODipine (NORVASC) 10 MG tablet Take 1 tablet (10 mg total) by mouth daily.  90 tablet  3  . aspirin EC 81 MG tablet Take 81 mg by mouth daily.      . bisoprolol-hydrochlorothiazide (ZIAC) 10-6.25 MG per tablet Take 1 tablet by mouth daily.  90 tablet  3  . losartan-hydrochlorothiazide (HYZAAR) 100-25 MG per tablet Take 1 tablet by mouth daily.  90 tablet  3   No current facility-administered medications on file prior to visit.    No Known Allergies  Past Medical History  Diagnosis Date  . Hyperlipidemia   . Hypertension   . Grave's disease     treated with RAI  . Vaginal delivery      x 2    Past Surgical History  Procedure Laterality Date  . Gyn surgery      hysterectomy form fibroids  . Breast surgery Right 2014    rt breast biopsy  . Breast surgery Right 2014    right breast lumpectomy    Family History  Problem Relation Age of Onset  . Stroke Mother   . Stroke Father   . Stroke Brother   . Cancer Maternal Grandfather     bone    History   Social History  . Marital Status: Married    Spouse Name: N/A    Number of Children: 2  . Years of Education: N/A   Occupational History  . Laid off from [employer], 08/2008    Social History Main Topics  . Smoking status: Former Games developer  . Smokeless tobacco: Never Used  . Alcohol Use: No  . Drug Use: No  . Sexual Activity: Not on file   Other Topics Concern  . Not on file   Social History Narrative  . No narrative on file   Review of Systems No sleep problems Appetite is good     Objective:   Physical Exam  Constitutional: She appears well-developed and  well-nourished. No distress.  Neck: Normal range of motion. Neck supple. No thyromegaly present.  Cardiovascular: Normal rate, regular rhythm and normal heart sounds.  Exam reveals no gallop.   No murmur heard. Pulmonary/Chest: Effort normal and breath sounds normal. No respiratory distress. She has no wheezes. She has no rales.  Lymphadenopathy:    She has no cervical adenopathy.          Assessment & Plan:

## 2013-04-18 ENCOUNTER — Other Ambulatory Visit: Payer: Self-pay | Admitting: Internal Medicine

## 2013-05-28 ENCOUNTER — Emergency Department: Payer: Self-pay | Admitting: Emergency Medicine

## 2013-06-15 ENCOUNTER — Ambulatory Visit: Payer: BC Managed Care – PPO | Admitting: Internal Medicine

## 2013-06-15 DIAGNOSIS — Z0289 Encounter for other administrative examinations: Secondary | ICD-10-CM

## 2014-02-22 ENCOUNTER — Other Ambulatory Visit: Payer: Self-pay | Admitting: Internal Medicine

## 2014-03-08 ENCOUNTER — Encounter: Payer: Self-pay | Admitting: Internal Medicine

## 2014-03-29 ENCOUNTER — Ambulatory Visit: Payer: BC Managed Care – PPO | Admitting: General Surgery

## 2014-04-12 ENCOUNTER — Encounter: Payer: BC Managed Care – PPO | Admitting: Internal Medicine

## 2014-04-12 ENCOUNTER — Telehealth: Payer: Self-pay | Admitting: Internal Medicine

## 2014-04-12 NOTE — Telephone Encounter (Signed)
Patient did not come for their scheduled appointment today for cpx  Please let me know if the patient needs to be contacted immediately for follow up or if no follow up is necessary.   ° °

## 2014-04-12 NOTE — Telephone Encounter (Signed)
Left message asking pt to call office please schedule cpx appointment see below

## 2014-04-12 NOTE — Telephone Encounter (Signed)
This should be rescheduled in the next few months

## 2014-04-14 ENCOUNTER — Encounter: Payer: Self-pay | Admitting: *Deleted

## 2014-04-14 NOTE — Telephone Encounter (Signed)
Left message asking pt to call office  °

## 2014-04-16 NOTE — Telephone Encounter (Signed)
Left message asking pt to call office  °

## 2014-04-19 ENCOUNTER — Encounter: Payer: Self-pay | Admitting: Internal Medicine

## 2014-04-19 NOTE — Telephone Encounter (Signed)
Mailed letter °

## 2014-04-22 ENCOUNTER — Other Ambulatory Visit: Payer: Self-pay | Admitting: Internal Medicine

## 2014-07-12 ENCOUNTER — Other Ambulatory Visit: Payer: Self-pay | Admitting: Internal Medicine

## 2014-07-20 ENCOUNTER — Ambulatory Visit: Payer: Self-pay | Admitting: Internal Medicine

## 2014-07-22 ENCOUNTER — Ambulatory Visit (INDEPENDENT_AMBULATORY_CARE_PROVIDER_SITE_OTHER): Payer: Self-pay | Admitting: Internal Medicine

## 2014-07-22 ENCOUNTER — Encounter: Payer: Self-pay | Admitting: Internal Medicine

## 2014-07-22 VITALS — BP 160/80 | HR 52 | Temp 98.1°F | Wt 206.0 lb

## 2014-07-22 DIAGNOSIS — E785 Hyperlipidemia, unspecified: Secondary | ICD-10-CM

## 2014-07-22 DIAGNOSIS — E669 Obesity, unspecified: Secondary | ICD-10-CM

## 2014-07-22 DIAGNOSIS — E0500 Thyrotoxicosis with diffuse goiter without thyrotoxic crisis or storm: Secondary | ICD-10-CM

## 2014-07-22 DIAGNOSIS — I10 Essential (primary) hypertension: Secondary | ICD-10-CM

## 2014-07-22 MED ORDER — LOSARTAN POTASSIUM-HCTZ 100-25 MG PO TABS: 1.0000 | ORAL_TABLET | Freq: Every day | ORAL | Status: DC

## 2014-07-22 MED ORDER — BISOPROLOL-HYDROCHLOROTHIAZIDE 10-6.25 MG PO TABS: 1.0000 | ORAL_TABLET | Freq: Every day | ORAL | Status: DC

## 2014-07-22 MED ORDER — AMLODIPINE BESYLATE 10 MG PO TABS: 10.0000 mg | ORAL_TABLET | Freq: Every day | ORAL | Status: DC

## 2014-07-22 NOTE — Assessment & Plan Note (Addendum)
Repeat on right 160/90 She is afraid of more meds She will check at pharmacy (BP) Discussed lifestyle changes Will recheck in 3 months before adding more meds Labs next time when has insurance

## 2014-07-22 NOTE — Assessment & Plan Note (Signed)
Discussed primary prevention in view of strong FH of strokes She still prefers no statin

## 2014-07-22 NOTE — Patient Instructions (Signed)
DASH Eating Plan DASH stands for "Dietary Approaches to Stop Hypertension." The DASH eating plan is a healthy eating plan that has been shown to reduce high blood pressure (hypertension). Additional health benefits may include reducing the risk of type 2 diabetes mellitus, heart disease, and stroke. The DASH eating plan may also help with weight loss. WHAT DO I NEED TO KNOW ABOUT THE DASH EATING PLAN? For the DASH eating plan, you will follow these general guidelines:  Choose foods with a percent daily value for sodium of less than 5% (as listed on the food label).  Use salt-free seasonings or herbs instead of table salt or sea salt.  Check with your health care provider or pharmacist before using salt substitutes.  Eat lower-sodium products, often labeled as "lower sodium" or "no salt added."  Eat fresh foods.  Eat more vegetables, fruits, and low-fat dairy products.  Choose whole grains. Look for the word "whole" as the first word in the ingredient list.  Choose fish and skinless chicken or turkey more often than red meat. Limit fish, poultry, and meat to 6 oz (170 g) each day.  Limit sweets, desserts, sugars, and sugary drinks.  Choose heart-healthy fats.  Limit cheese to 1 oz (28 g) per day.  Eat more home-cooked food and less restaurant, buffet, and fast food.  Limit fried foods.  Cook foods using methods other than frying.  Limit canned vegetables. If you do use them, rinse them well to decrease the sodium.  When eating at a restaurant, ask that your food be prepared with less salt, or no salt if possible. WHAT FOODS CAN I EAT? Seek help from a dietitian for individual calorie needs. Grains Whole grain or whole wheat bread. Brown rice. Whole grain or whole wheat pasta. Quinoa, bulgur, and whole grain cereals. Low-sodium cereals. Corn or whole wheat flour tortillas. Whole grain cornbread. Whole grain crackers. Low-sodium crackers. Vegetables Fresh or frozen vegetables  (raw, steamed, roasted, or grilled). Low-sodium or reduced-sodium tomato and vegetable juices. Low-sodium or reduced-sodium tomato sauce and paste. Low-sodium or reduced-sodium canned vegetables.  Fruits All fresh, canned (in natural juice), or frozen fruits. Meat and Other Protein Products Ground beef (85% or leaner), grass-fed beef, or beef trimmed of fat. Skinless chicken or turkey. Ground chicken or turkey. Pork trimmed of fat. All fish and seafood. Eggs. Dried beans, peas, or lentils. Unsalted nuts and seeds. Unsalted canned beans. Dairy Low-fat dairy products, such as skim or 1% milk, 2% or reduced-fat cheeses, low-fat ricotta or cottage cheese, or plain low-fat yogurt. Low-sodium or reduced-sodium cheeses. Fats and Oils Tub margarines without trans fats. Light or reduced-fat mayonnaise and salad dressings (reduced sodium). Avocado. Safflower, olive, or canola oils. Natural peanut or almond butter. Other Unsalted popcorn and pretzels. The items listed above may not be a complete list of recommended foods or beverages. Contact your dietitian for more options. WHAT FOODS ARE NOT RECOMMENDED? Grains White bread. White pasta. White rice. Refined cornbread. Bagels and croissants. Crackers that contain trans fat. Vegetables Creamed or fried vegetables. Vegetables in a cheese sauce. Regular canned vegetables. Regular canned tomato sauce and paste. Regular tomato and vegetable juices. Fruits Dried fruits. Canned fruit in light or heavy syrup. Fruit juice. Meat and Other Protein Products Fatty cuts of meat. Ribs, chicken wings, bacon, sausage, bologna, salami, chitterlings, fatback, hot dogs, bratwurst, and packaged luncheon meats. Salted nuts and seeds. Canned beans with salt. Dairy Whole or 2% milk, cream, half-and-half, and cream cheese. Whole-fat or sweetened yogurt. Full-fat   cheeses or blue cheese. Nondairy creamers and whipped toppings. Processed cheese, cheese spreads, or cheese  curds. Condiments Onion and garlic salt, seasoned salt, table salt, and sea salt. Canned and packaged gravies. Worcestershire sauce. Tartar sauce. Barbecue sauce. Teriyaki sauce. Soy sauce, including reduced sodium. Steak sauce. Fish sauce. Oyster sauce. Cocktail sauce. Horseradish. Ketchup and mustard. Meat flavorings and tenderizers. Bouillon cubes. Hot sauce. Tabasco sauce. Marinades. Taco seasonings. Relishes. Fats and Oils Butter, stick margarine, lard, shortening, ghee, and bacon fat. Coconut, palm kernel, or palm oils. Regular salad dressings. Other Pickles and olives. Salted popcorn and pretzels. The items listed above may not be a complete list of foods and beverages to avoid. Contact your dietitian for more information. WHERE CAN I FIND MORE INFORMATION? National Heart, Lung, and Blood Institute: www.nhlbi.nih.gov/health/health-topics/topics/dash/ Document Released: 04/12/2011 Document Revised: 09/07/2013 Document Reviewed: 02/25/2013 ExitCare Patient Information 2015 [employer]. This information is not intended to replace advice given to you by your health care provider. Make sure you discuss any questions you have with your health care provider. Exercise to Lose Weight Exercise and a healthy diet may help you lose weight. Your doctor may suggest specific exercises. EXERCISE IDEAS AND TIPS  Choose low-cost things you enjoy doing, such as walking, bicycling, or exercising to workout videos.  Take stairs instead of the elevator.  Walk during your lunch break.  Park your car further away from work or school.  Go to a gym or an exercise class.  Start with 5 to 10 minutes of exercise each day. Build up to 30 minutes of exercise 4 to 6 days a week.  Wear shoes with good support and comfortable clothes.  Stretch before and after working out.  Work out until you breathe harder and your heart beats faster.  Drink extra water when you exercise.  Do not do so much that you  hurt yourself, feel dizzy, or get very short of breath. Exercises that burn about 150 calories:  Running 1  miles in 15 minutes.  Playing volleyball for 45 to 60 minutes.  Washing and waxing a car for 45 to 60 minutes.  Playing touch football for 45 minutes.  Walking 1  miles in 35 minutes.  Pushing a stroller 1  miles in 30 minutes.  Playing basketball for 30 minutes.  Raking leaves for 30 minutes.  Bicycling 5 miles in 30 minutes.  Walking 2 miles in 30 minutes.  Dancing for 30 minutes.  Shoveling snow for 15 minutes.  Swimming laps for 20 minutes.  Walking up stairs for 15 minutes.  Bicycling 4 miles in 15 minutes.  Gardening for 30 to 45 minutes.  Jumping rope for 15 minutes.  Washing windows or floors for 45 to 60 minutes. Document Released: 05/26/2010 Document Revised: 07/16/2011 Document Reviewed: 05/26/2010 ExitCare Patient Information 2015 [employer]. This information is not intended to replace advice given to you by your health care provider. Make sure you discuss any questions you have with your health care provider.  

## 2014-07-22 NOTE — Progress Notes (Signed)
   Subjective:    Patient ID: Kristine Hammond, female    DOB: 1951/04/28, 64 y.o.   MRN: [id number]  HPI Here for follow up of chronic medical problems  Doing fine Retired after being laid [employer] problems Sister had stroke-- ?aneurysm  No exercise --discussed Doesn't check BP No chest pain No SOB No dizziness or syncope No headaches  Current Outpatient Prescriptions on File Prior to Visit  Medication Sig Dispense Refill  . amLODipine (NORVASC) 10 MG tablet TAKE 1 TABLET (10 MG TOTAL) BY MOUTH DAILY. 30 tablet 0  . aspirin EC 81 MG tablet Take 81 mg by mouth daily.    . bisoprolol-hydrochlorothiazide (ZIAC) 10-6.25 MG per tablet TAKE 1 TABLET BY MOUTH DAILY. 90 tablet 0  . losartan-hydrochlorothiazide (HYZAAR) 100-25 MG per tablet TAKE 1 TABLET BY MOUTH DAILY. 30 tablet 0   No current facility-administered medications on file prior to visit.    No Known Allergies  Past Medical History  Diagnosis Date  . Hyperlipidemia   . Hypertension   . Grave's disease     treated with RAI  . Vaginal delivery      x 2    Past Surgical History  Procedure Laterality Date  . Gyn surgery      hysterectomy form fibroids  . Breast surgery Right 2014    rt breast biopsy  . Breast surgery Right 2014    right breast lumpectomy    Family History  Problem Relation Age of Onset  . Stroke Mother   . Stroke Father   . Stroke Brother   . Cancer Maternal Grandfather     bone  . Stroke Sister   . Stroke Sister     History   Social History  . Marital Status: Married    Spouse Name: N/A  . Number of Children: 2  . Years of Education: N/A   Occupational History  . [employer]     Retired   Social History Main Topics  . Smoking status: Former Research scientist (life sciences)  . Smokeless tobacco: Never Used  . Alcohol Use: No  . Drug Use: No  . Sexual Activity: Not on file   Other Topics Concern  . Not on file   Social History Narrative   Review of Systems  Sleeps  well Appetite is fine Weight stable     Objective:   Physical Exam  Constitutional: She appears well-developed and well-nourished. No distress.  Neck: Normal range of motion. Neck supple. No thyromegaly present.  Cardiovascular: Normal rate, regular rhythm, normal heart sounds and intact distal pulses.  Exam reveals no gallop.   No murmur heard. Pulmonary/Chest: Effort normal and breath sounds normal. No respiratory distress. She has no wheezes. She has no rales.  Musculoskeletal: She exhibits no edema or tenderness.  Lymphadenopathy:    She has no cervical adenopathy.  Psychiatric: She has a normal mood and affect. Her behavior is normal.          Assessment & Plan:

## 2014-07-22 NOTE — Progress Notes (Signed)
Pre visit review using our clinic review tool, if applicable. No additional management support is needed unless otherwise documented below in the visit note. 

## 2014-07-22 NOTE — Assessment & Plan Note (Signed)
Seems euthyroid Will recheck labs 

## 2014-07-22 NOTE — Assessment & Plan Note (Signed)
Info given on lifestyle changes

## 2014-07-23 ENCOUNTER — Telehealth: Payer: Self-pay | Admitting: Internal Medicine

## 2014-07-23 NOTE — Telephone Encounter (Signed)
emmi emailed °

## 2014-08-04 ENCOUNTER — Encounter: Payer: Self-pay | Admitting: Internal Medicine

## 2014-08-27 NOTE — Op Note (Signed)
PATIENT NAME:  Kristine Hammond, Kristine Hammond MR#:  [id number] DATE OF BIRTH:  23-Apr-1951  DATE OF PROCEDURE:  03/31/2013  PREOPERATIVE DIAGNOSIS: Complex sclerosing lesion, right breast.   POSTOPERATIVE DIAGNOSIS: Complex sclerosing lesion, right breast.   OPERATION: Right breast lumpectomy.   ANESTHESIA: General.   COMPLICATIONS: None.   ESTIMATED BLOOD LOSS: Minimal.   DRAINS: None.   PROCEDURE: The patient underwent initial stereotactic biopsy several days ago showing a complex sclerosing lesion in the right breast. This is a high risk pathologic finding and a lumpectomy was planned. She was brought to the Operating Room and placed in supine position on the operating table and put to sleep with LMA. Right breast was prepped and draped out as a sterile field. Timeout was performed. Ultrasound was then brought up to the field. The area of the biopsy cavity was evaluated and the clip was identified, along with a small biopsy cavity in this location. With a tiny stab incision a Bard ultra wire was positioned going through this area of prior biopsy and anchored in place. Marcaine 0.50% totaling 10 mL was instilled. Incision was made along the upper outer quadrant of the right breast where the wire was located in a curved fashion around the areola and deepened through the layers down to the breast tissue. Using the wire as a guide and also looking at the areas of mild bruising associated with the biopsy, excision was then performed. The excised tissue was tagged for margins and sent to pathology. Grossly, there did not appear to be an actual mass other than for the biopsy cavity and the surrounding breast tissue showed no abnormality. After ensuring hemostasis, the deeper tissue was closed with 2-0 Vicryl and the skin closed with subcuticular 4-0 Vicryl, covered with Dermabond. The procedure was well tolerated. She was subsequently extubated and returned to the recovery room in stable condition.     ____________________________ S.Robinette Haines, MD sgs:sg D: 03/31/2013 12:27:44 ET T: 03/31/2013 13:19:34 ET JOB#: [id number]  cc: Synthia Innocent. Jamal Collin, MD, <Dictator> [facility] Robinette Haines MD ELECTRONICALLY SIGNED 03/31/2013 16:12

## 2014-10-25 ENCOUNTER — Ambulatory Visit: Payer: Self-pay | Admitting: Internal Medicine

## 2014-12-10 ENCOUNTER — Encounter (INDEPENDENT_AMBULATORY_CARE_PROVIDER_SITE_OTHER): Payer: Self-pay

## 2014-12-10 ENCOUNTER — Encounter: Payer: Self-pay | Admitting: Internal Medicine

## 2014-12-10 ENCOUNTER — Ambulatory Visit (INDEPENDENT_AMBULATORY_CARE_PROVIDER_SITE_OTHER): Admitting: Internal Medicine

## 2014-12-10 ENCOUNTER — Encounter: Payer: Self-pay | Admitting: *Deleted

## 2014-12-10 VITALS — BP 140/80 | HR 56 | Temp 98.3°F | Wt 212.0 lb

## 2014-12-10 DIAGNOSIS — R609 Edema, unspecified: Secondary | ICD-10-CM

## 2014-12-10 DIAGNOSIS — E0500 Thyrotoxicosis with diffuse goiter without thyrotoxic crisis or storm: Secondary | ICD-10-CM | POA: Diagnosis not present

## 2014-12-10 DIAGNOSIS — I10 Essential (primary) hypertension: Secondary | ICD-10-CM | POA: Diagnosis not present

## 2014-12-10 DIAGNOSIS — E785 Hyperlipidemia, unspecified: Secondary | ICD-10-CM | POA: Diagnosis not present

## 2014-12-10 LAB — LIPID PANEL
Cholesterol: 188 mg/dL (ref 0–200)
HDL: 50.6 mg/dL (ref >39.00)
LDL Cholesterol: 125 mg/dL — ABNORMAL HIGH (ref 0–99)
NonHDL: 137.31
Total CHOL/HDL Ratio: 4
Triglycerides: 60 mg/dL (ref 0.0–149.0)
VLDL: 12 mg/dL (ref 0.0–40.0)

## 2014-12-10 LAB — COMPREHENSIVE METABOLIC PANEL
ALT: 10 U/L (ref 0–35)
AST: 13 U/L (ref 0–37)
Albumin: 4 g/dL (ref 3.5–5.2)
Alkaline Phosphatase: 40 U/L (ref 39–117)
BUN: 16 mg/dL (ref 6–23)
CHLORIDE: 103 meq/L (ref 96–112)
CO2: 32 mEq/L (ref 19–32)
Calcium: 9.5 mg/dL (ref 8.4–10.5)
Creatinine, Ser: 0.88 mg/dL (ref 0.40–1.20)
GFR: 83.24 mL/min (ref >60.00)
Glucose, Bld: 103 mg/dL — ABNORMAL HIGH (ref 70–99)
Potassium: 3.7 mEq/L (ref 3.5–5.1)
Sodium: 142 mEq/L (ref 135–145)
Total Bilirubin: 0.3 mg/dL (ref 0.2–1.2)
Total Protein: 7.6 g/dL (ref 6.0–8.3)

## 2014-12-10 LAB — CBC WITH DIFFERENTIAL/PLATELET
Basophils Absolute: 0 10*3/uL (ref 0.0–0.1)
Basophils Relative: 0.5 % (ref 0.0–3.0)
EOS ABS: 0.1 10*3/uL (ref 0.0–0.7)
Eosinophils Relative: 2.3 % (ref 0.0–5.0)
HEMATOCRIT: 41.7 % (ref 36.0–46.0)
Hemoglobin: 13.2 g/dL (ref 12.0–15.0)
Lymphocytes Relative: 38.1 % (ref 12.0–46.0)
Lymphs Abs: 2.2 10*3/uL (ref 0.7–4.0)
MCHC: 31.7 g/dL (ref 30.0–36.0)
MCV: 82.9 fl (ref 78.0–100.0)
MONO ABS: 0.4 10*3/uL (ref 0.1–1.0)
MONOS PCT: 6.4 % (ref 3.0–12.0)
NEUTROS ABS: 3 10*3/uL (ref 1.4–7.7)
Neutrophils Relative %: 52.7 % (ref 43.0–77.0)
PLATELETS: 191 10*3/uL (ref 150.0–400.0)
RBC: 5.03 Mil/uL (ref 3.87–5.11)
RDW: 16 % — AB (ref 11.5–15.5)
WBC: 5.8 10*3/uL (ref 4.0–10.5)

## 2014-12-10 LAB — T4, FREE: Free T4: 0.9 ng/dL (ref 0.60–1.60)

## 2014-12-10 LAB — TSH: TSH: 1.44 u[IU]/mL (ref 0.35–4.50)

## 2014-12-10 NOTE — Progress Notes (Signed)
   Subjective:    Patient ID: Kristine Hammond, female    DOB: 11-15-1950, 64 y.o.   MRN: [id number]  HPI Here due to foot swelling Noticing in both feet Seems some better today  Started a couple of weeks ago Bad in evening--better in AM Using support socks--this helps  No SOB No chest pain No dizziness or syncope No palpitations  Current Outpatient Prescriptions on File Prior to Visit  Medication Sig Dispense Refill  . amLODipine (NORVASC) 10 MG tablet Take 1 tablet (10 mg total) by mouth daily. 90 tablet 3  . aspirin EC 81 MG tablet Take 81 mg by mouth daily.    . bisoprolol-hydrochlorothiazide (ZIAC) 10-6.25 MG per tablet Take 1 tablet by mouth daily. 90 tablet 3  . losartan-hydrochlorothiazide (HYZAAR) 100-25 MG per tablet Take 1 tablet by mouth daily. 90 tablet 3   No current facility-administered medications on file prior to visit.    No Known Allergies  Past Medical History  Diagnosis Date  . Hyperlipidemia   . Hypertension   . Grave's disease     treated with RAI  . Vaginal delivery      x 2    Past Surgical History  Procedure Laterality Date  . Gyn surgery      hysterectomy form fibroids  . Breast surgery Right 2014    rt breast biopsy  . Breast surgery Right 2014    right breast lumpectomy    Family History  Problem Relation Age of Onset  . Stroke Mother   . Stroke Father   . Stroke Brother   . Cancer Maternal Grandfather     bone  . Stroke Sister   . Stroke Sister     History   Social History  . Marital Status: Married    Spouse Name: N/A  . Number of Children: 2  . Years of Education: N/A   Occupational History  . [employer]     Retired   Social History Main Topics  . Smoking status: Former Research scientist (life sciences)  . Smokeless tobacco: Never Used  . Alcohol Use: No  . Drug Use: No  . Sexual Activity: Not on file   Other Topics Concern  . Not on file   Social History Narrative   Review of Systems Doesn't check BP Sleeps  fine Gained 6# since last month Didn't start walking    Objective:   Physical Exam  Constitutional: She appears well-developed and well-nourished. No distress.  Neck: Normal range of motion. Neck supple. No thyromegaly present.  Cardiovascular: Normal rate, regular rhythm, normal heart sounds and intact distal pulses.  Exam reveals no gallop.   No murmur heard. Pulmonary/Chest: Effort normal and breath sounds normal. No respiratory distress. She has no wheezes. She has no rales.  Abdominal: Soft. She exhibits no mass. There is no tenderness.  Musculoskeletal:  1+ non pitting edema in feet and calves  Lymphadenopathy:    She has no cervical adenopathy.  Skin:  No ulcers  Psychiatric: She has a normal mood and affect. Her behavior is normal.          Assessment & Plan:

## 2014-12-10 NOTE — Assessment & Plan Note (Signed)
BP Readings from Last 3 Encounters:  12/10/14 140/80  07/22/14 160/80  04/10/13 136/84   Better Will hold off on other meds

## 2014-12-10 NOTE — Assessment & Plan Note (Signed)
Seems to be just venous insufficiency Will check labs Discussed cutting out salt, elevation and support socks

## 2014-12-10 NOTE — Assessment & Plan Note (Signed)
Seems to be euthyroid Will check labs 

## 2014-12-10 NOTE — Progress Notes (Signed)
Pre visit review using our clinic review tool, if applicable. No additional management support is needed unless otherwise documented below in the visit note. 

## 2014-12-10 NOTE — Assessment & Plan Note (Signed)
Mild only No strong FH of CAD She doesn't want primary prevention

## 2015-07-08 IMAGING — MG MM ADDITIONAL VIEWS AT NO CHARGE
1 series · 3 of 3 positions shown · non-contrast
Comparison: 01/29/2013

REASON FOR EXAM: AV RT BRST CALCIFICATIONS
COMMENTS:

PROCEDURE:     MAM - MAM DIG ADDVIEWS RT SCR  - February 18, 2013  [DATE]
CLINICAL DATA: Calcifications right breast identified on recent
screening mammogram.
EXAM:
MAMMOGRAPHIC ADDITIONAL VIEWS

[R CC · right · 3 of 3 slices shown]
[im 1/3]
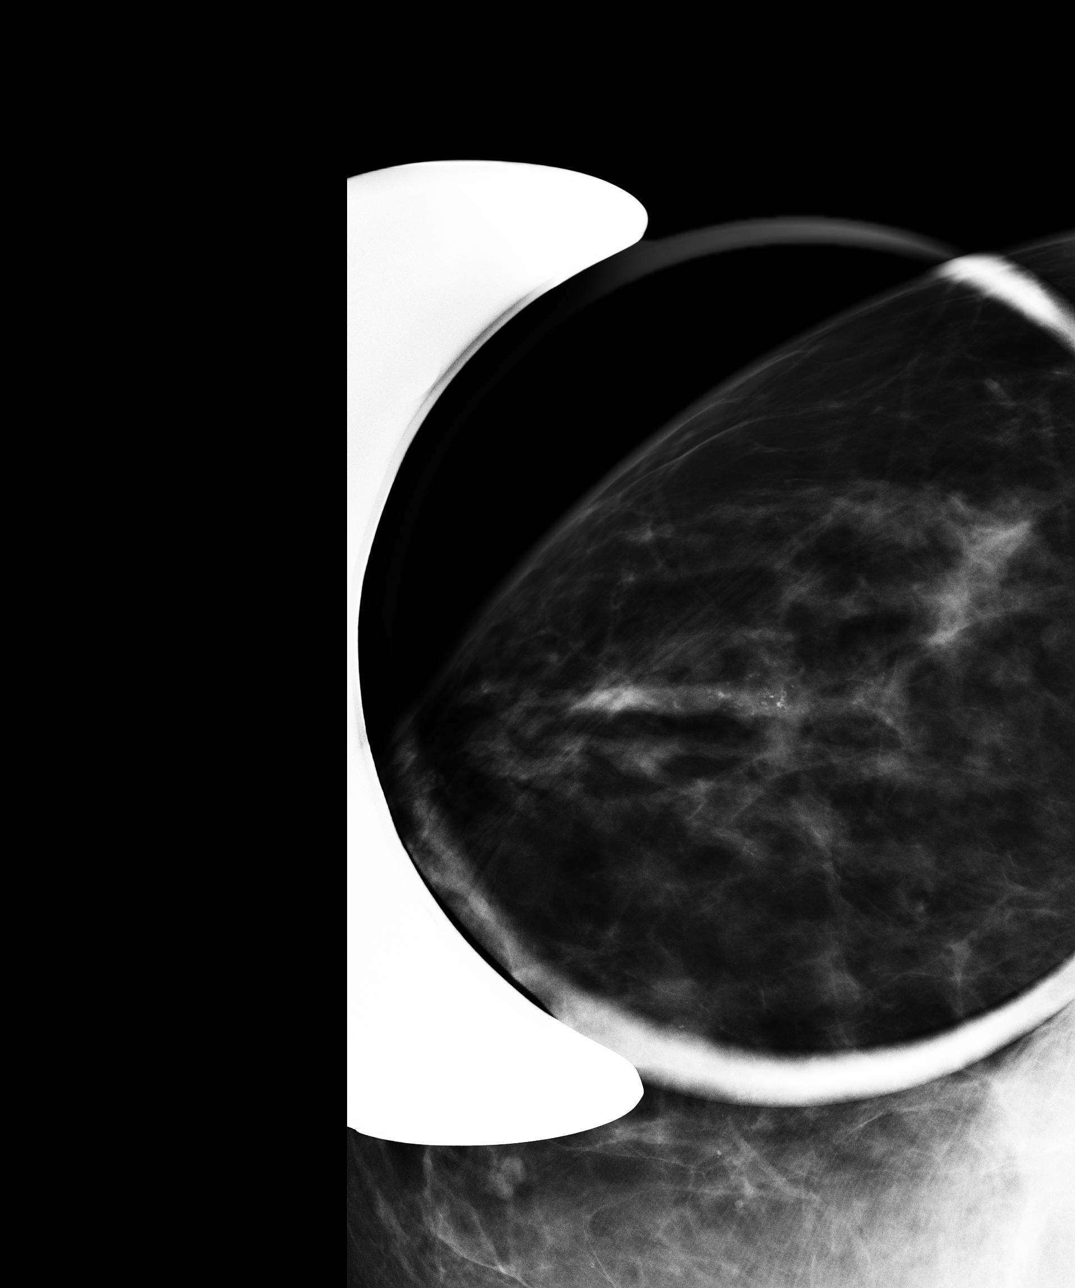
[im 2/3]
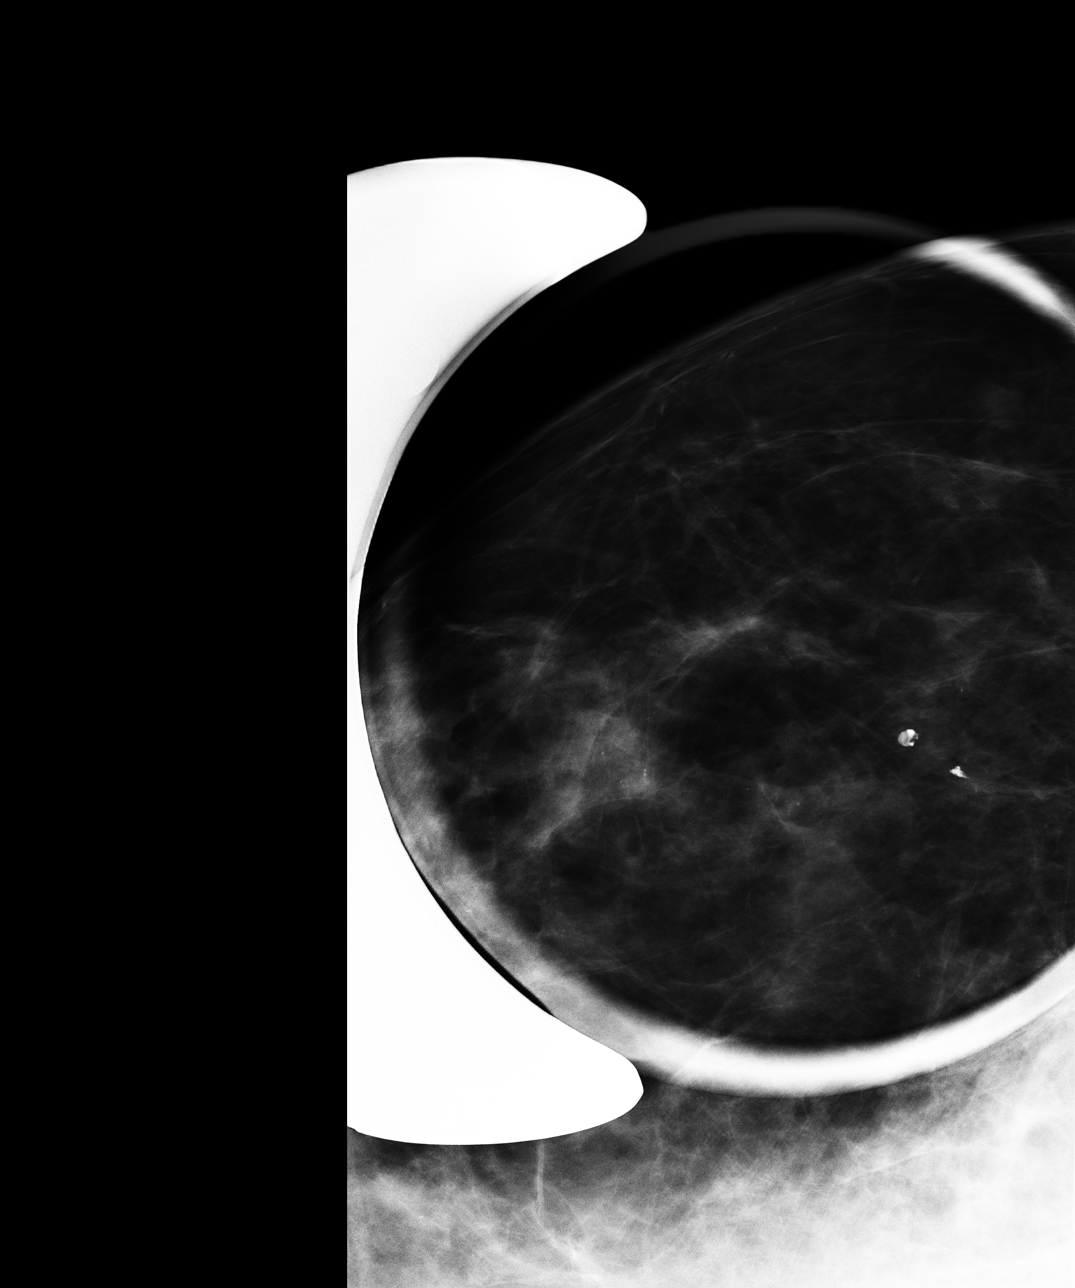
[im 3/3]
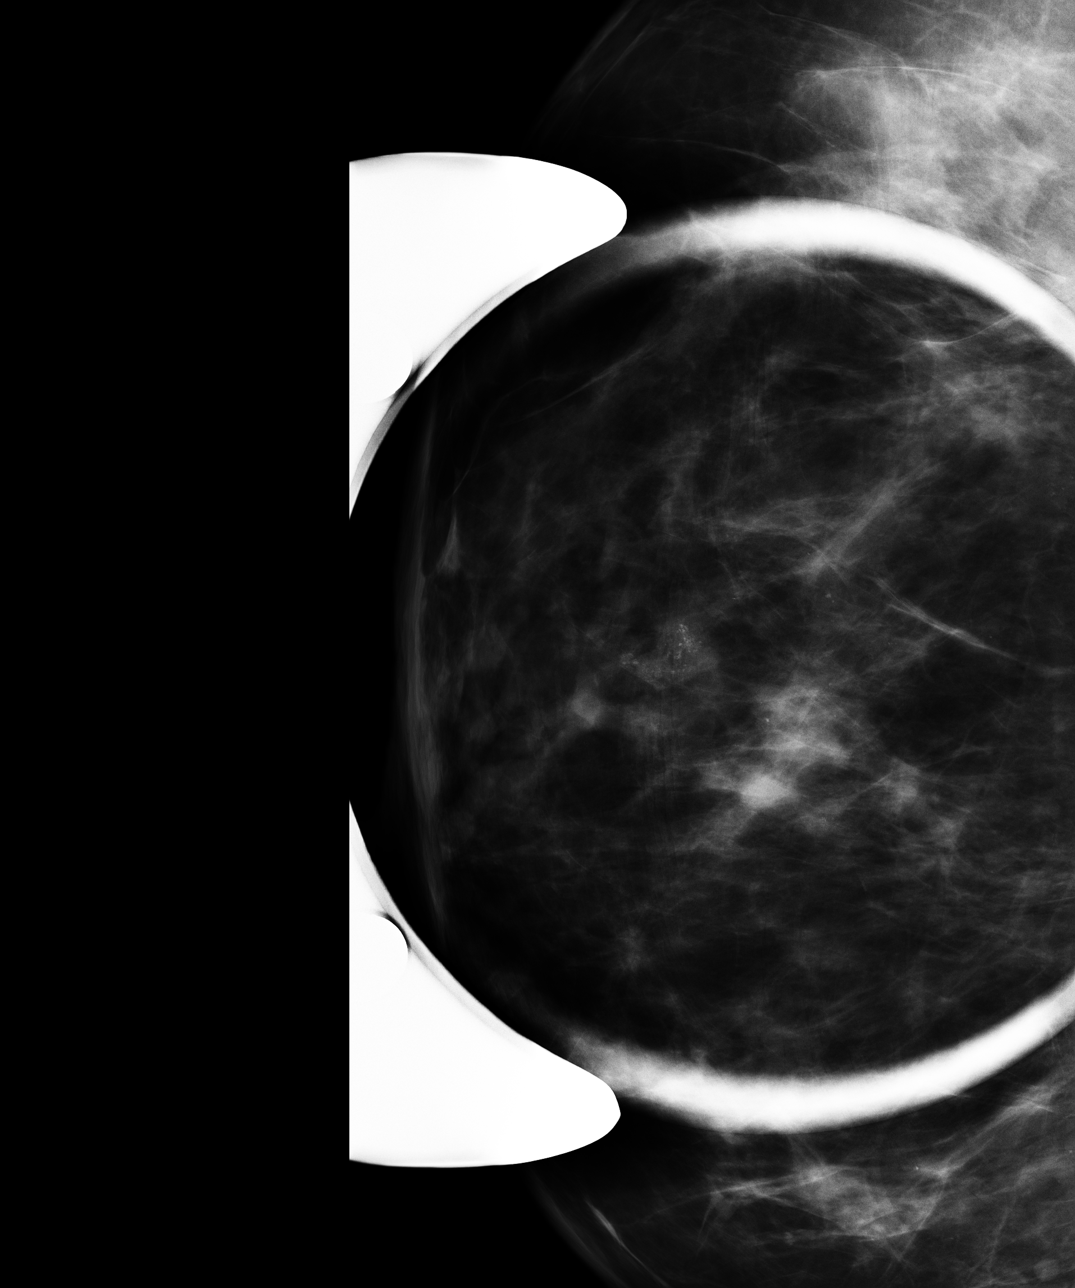

[3 of 3 positions shown; findings below may reference images not displayed]

ACR Breast Density Category b: There are scattered areas of
fibroglandular density.
FINDINGS: Magnification views of the outer right breast discuss that
magnification views of the upper outer quadrant of the right breast
demonstrate a 0.8 x 1.0 x 1.0 cm tight group of heterogeneous
calcifications.

RECOMMENDATION:
Stereotactic biopsy of the right breast is recommended. The
procedure was discussed with the patient today. The ordering
physician's office was called by the Jim, and the patient has
been scheduled for stereotactic biopsy on 02/23/2013 at 3 o'clock
p.m.

I have discussed the findings and recommendations with the patient.
Results were also provided in writing at the conclusion of the
visit. If applicable, a reminder letter will be sent to the patient
regarding the next appointment.

BI-RADS CATEGORY  4: Suspicious abnormality - biopsy should be
considered.
IMPRESSION: Growth of heterogeneous calcifications in the upper outer right
breast. Ductal carcinoma in situ cannot be excluded. Benign
etiologies such as fibrous cystic changes are also considered.

## 2015-08-19 ENCOUNTER — Other Ambulatory Visit: Payer: Self-pay | Admitting: Internal Medicine

## 2015-12-14 ENCOUNTER — Encounter: Admitting: Internal Medicine

## 2015-12-14 ENCOUNTER — Telehealth: Payer: Self-pay | Admitting: Internal Medicine

## 2015-12-14 DIAGNOSIS — Z0289 Encounter for other administrative examinations: Secondary | ICD-10-CM

## 2015-12-14 NOTE — Telephone Encounter (Signed)
Patient did not come in for their appointment today for cpe.  Please let me know if patient needs to be contacted immediately for follow up or no follow up needed. °

## 2015-12-15 NOTE — Telephone Encounter (Signed)
Should be rescheduled at her earliest convenience

## 2015-12-23 NOTE — Telephone Encounter (Signed)
L/m for pt to call back to r/s appt  °

## 2015-12-26 ENCOUNTER — Other Ambulatory Visit: Payer: Self-pay | Admitting: Internal Medicine

## 2015-12-30 ENCOUNTER — Encounter: Payer: Self-pay | Admitting: Internal Medicine

## 2015-12-30 NOTE — Telephone Encounter (Signed)
Sent letter to pt to r/s appt

## 2016-04-19 ENCOUNTER — Other Ambulatory Visit: Payer: Self-pay | Admitting: Internal Medicine

## 2016-05-03 ENCOUNTER — Encounter: Payer: Self-pay | Admitting: Internal Medicine

## 2016-05-03 ENCOUNTER — Ambulatory Visit (INDEPENDENT_AMBULATORY_CARE_PROVIDER_SITE_OTHER): Admitting: Internal Medicine

## 2016-05-03 VITALS — BP 150/94 | HR 57 | Temp 98.1°F | Ht 66.0 in | Wt 212.0 lb

## 2016-05-03 DIAGNOSIS — E0500 Thyrotoxicosis with diffuse goiter without thyrotoxic crisis or storm: Secondary | ICD-10-CM

## 2016-05-03 DIAGNOSIS — Z23 Encounter for immunization: Secondary | ICD-10-CM

## 2016-05-03 DIAGNOSIS — Z7189 Other specified counseling: Secondary | ICD-10-CM | POA: Diagnosis not present

## 2016-05-03 DIAGNOSIS — I10 Essential (primary) hypertension: Secondary | ICD-10-CM

## 2016-05-03 DIAGNOSIS — E785 Hyperlipidemia, unspecified: Secondary | ICD-10-CM | POA: Diagnosis not present

## 2016-05-03 DIAGNOSIS — Z0000 Encounter for general adult medical examination without abnormal findings: Secondary | ICD-10-CM | POA: Diagnosis not present

## 2016-05-03 DIAGNOSIS — E2839 Other primary ovarian failure: Secondary | ICD-10-CM

## 2016-05-03 NOTE — Progress Notes (Signed)
Pre visit review using our clinic review tool, if applicable. No additional management support is needed unless otherwise documented below in the visit note. 

## 2016-05-03 NOTE — Patient Instructions (Addendum)
Please set up an appointment for a breast follow up with Dr Jamal Collin.  DASH Eating Plan DASH stands for "Dietary Approaches to Stop Hypertension." The DASH eating plan is a healthy eating plan that has been shown to reduce high blood pressure (hypertension). Additional health benefits may include reducing the risk of type 2 diabetes mellitus, heart disease, and stroke. The DASH eating plan may also help with weight loss. What do I need to know about the DASH eating plan? For the DASH eating plan, you will follow these general guidelines:  Choose foods with less than 150 milligrams of sodium per serving (as listed on the food label).  Use salt-free seasonings or herbs instead of table salt or sea salt.  Check with your health care provider or pharmacist before using salt substitutes.  Eat lower-sodium products. These are often labeled as "low-sodium" or "no salt added."  Eat fresh foods. Avoid eating a lot of canned foods.  Eat more vegetables, fruits, and low-fat dairy products.  Choose whole grains. Look for the word "whole" as the first word in the ingredient list.  Choose fish and skinless chicken or [place] more often than red meat. Limit fish, poultry, and meat to 6 oz (170 g) each day.  Limit sweets, desserts, sugars, and sugary drinks.  Choose heart-healthy fats.  Eat more home-cooked food and less restaurant, buffet, and fast food.  Limit fried foods.  Do not fry foods. Cook foods using methods such as baking, boiling, grilling, and broiling instead.  When eating at a restaurant, ask that your food be prepared with less salt, or no salt if possible. What foods can I eat? Seek help from a dietitian for individual calorie needs. Grains  Whole grain or whole wheat bread. Brown rice. Whole grain or whole wheat pasta. Quinoa, bulgur, and whole grain cereals. Low-sodium cereals. Corn or whole wheat flour tortillas. Whole grain cornbread. Whole grain crackers. Low-sodium  crackers. Vegetables  Fresh or frozen vegetables (raw, steamed, roasted, or grilled). Low-sodium or reduced-sodium tomato and vegetable juices. Low-sodium or reduced-sodium tomato sauce and paste. Low-sodium or reduced-sodium canned vegetables. Fruits  All fresh, canned (in natural juice), or frozen fruits. Meat and Other Protein Products  Ground beef (85% or leaner), grass-fed beef, or beef trimmed of fat. Skinless chicken or [place]. Ground chicken or [place]. Pork trimmed of fat. All fish and seafood. Eggs. Dried beans, peas, or lentils. Unsalted nuts and seeds. Unsalted canned beans. Dairy  Low-fat dairy products, such as skim or 1% milk, 2% or reduced-fat cheeses, low-fat ricotta or cottage cheese, or plain low-fat yogurt. Low-sodium or reduced-sodium cheeses. Fats and Oils  Tub margarines without trans fats. Light or reduced-fat mayonnaise and salad dressings (reduced sodium). Avocado. Safflower, olive, or canola oils. Natural peanut or almond butter. Other  Unsalted popcorn and pretzels. The items listed above may not be a complete list of recommended foods or beverages. Contact your dietitian for more options.  What foods are not recommended? Grains  White bread. White pasta. White rice. Refined cornbread. Bagels and croissants. Crackers that contain trans fat. Vegetables  Creamed or fried vegetables. Vegetables in a cheese sauce. Regular canned vegetables. Regular canned tomato sauce and paste. Regular tomato and vegetable juices. Fruits  Canned fruit in light or heavy syrup. Fruit juice. Meat and Other Protein Products  Fatty cuts of meat. Ribs, chicken wings, bacon, sausage, bologna, salami, chitterlings, fatback, hot dogs, bratwurst, and packaged luncheon meats. Salted nuts and seeds. Canned beans with salt. Dairy  Whole  or 2% milk, cream, half-and-half, and cream cheese. Whole-fat or sweetened yogurt. Full-fat cheeses or blue cheese. Nondairy creamers and whipped toppings.  Processed cheese, cheese spreads, or cheese curds. Condiments  Onion and garlic salt, seasoned salt, table salt, and sea salt. Canned and packaged gravies. Worcestershire sauce. Tartar sauce. Barbecue sauce. Teriyaki sauce. Soy sauce, including reduced sodium. Steak sauce. Fish sauce. Oyster sauce. Cocktail sauce. Horseradish. Ketchup and mustard. Meat flavorings and tenderizers. Bouillon cubes. Hot sauce. Tabasco sauce. Marinades. Taco seasonings. Relishes. Fats and Oils  Butter, stick margarine, lard, shortening, ghee, and bacon fat. Coconut, palm kernel, or palm oils. Regular salad dressings. Other  Pickles and olives. Salted popcorn and pretzels. The items listed above may not be a complete list of foods and beverages to avoid. Contact your dietitian for more information.  Where can I find more information? National Heart, Lung, and Blood Institute: travelstabloid.com This information is not intended to replace advice given to you by your health care provider. Make sure you discuss any questions you have with your health care provider. Document Released: 04/12/2011 Document Revised: 09/29/2015 Document Reviewed: 02/25/2013 Elsevier Interactive Patient Education  2017 [employer].

## 2016-05-03 NOTE — Progress Notes (Signed)
Subjective:    Patient ID: Kristine Hammond, female    DOB: 1950/11/25, 65 y.o.   MRN: [id number]  HPI Here for Welcome to Medicare visit and follow up of chronic health conditions Reviewed form and advanced directives Reviewed other doctors No alcohol or tobacco No exercise---is considering joining the [employer] and we discussed this No falls No depression or anhedonia Vision is fine. Mild issues with hearing--but seems to be okay No hospitalizations or procedures in past year Independent with instrumental ADLs No cognitive problems  She is doing well Doesn't really check her BP No headache No chest pain or SOB No dizziness or syncope Still some swelling--- better now  Energy levels okay No change in skin, hair or nails Weight is stable  Current Outpatient Prescriptions on File Prior to Visit  Medication Sig Dispense Refill  . amLODipine (NORVASC) 10 MG tablet TAKE 1 TABLET (10 MG TOTAL) BY MOUTH DAILY. 90 tablet 0  . bisoprolol-hydrochlorothiazide (ZIAC) 10-6.25 MG tablet TAKE 1 TABLET BY MOUTH DAILY. 90 tablet 0  . losartan-hydrochlorothiazide (HYZAAR) 100-25 MG tablet TAKE 1 TABLET BY MOUTH DAILY. 90 tablet 0   No current facility-administered medications on file prior to visit.     No Known Allergies  Past Medical History:  Diagnosis Date  . Grave's disease    treated with RAI  . Hyperlipidemia   . Hypertension   . Vaginal delivery     x 2    Past Surgical History:  Procedure Laterality Date  . BREAST SURGERY Right 2014   rt breast biopsy  . BREAST SURGERY Right 2014   right breast lumpectomy  . gyn surgery     hysterectomy form fibroids    Family History  Problem Relation Age of Onset  . Stroke Mother   . Stroke Father   . Stroke Brother   . Stroke Sister   . Stroke Sister   . Cancer Maternal Grandfather     bone    Social History   Social History  . Marital status: Married    Spouse name: N/A  . Number of children: 2  . Years of education:  N/A   Occupational History  . [employer]     Retired   Social History Main Topics  . Smoking status: Former Research scientist (life sciences)  . Smokeless tobacco: Never Used  . Alcohol use No  . Drug use: No  . Sexual activity: Not on file   Other Topics Concern  . Not on file   Social History Narrative   No living will--plans to do this soon   Husband to be health care POA   Would accept resuscitation   Would accept feeding tube   Review of Systems Appetite is good Sleeps well Wears seat belt Teeth are okay--- keeps up with dentist ([place]) No sig back pain Some knee pain--uses BC's fairly regular Bowels are normal. No blood Voids fine. Some stress incontinence--wears pad at night. Not urge No skin rash or suspicious lesions     Objective:   Physical Exam  Constitutional: She is oriented to person, place, and time. She appears well-developed and well-nourished. No distress.  HENT:  Mouth/Throat: Oropharynx is clear and moist. No oropharyngeal exudate.  Neck: Normal range of motion. Neck supple. No thyromegaly present.  Cardiovascular: Normal rate, regular rhythm, normal heart sounds and intact distal pulses.  Exam reveals no gallop.   No murmur heard. Pulmonary/Chest: Effort normal and breath sounds normal. No respiratory distress. She has no wheezes. She  has no rales.  Abdominal: Soft. There is no tenderness.  Musculoskeletal:  Trace edema  Lymphadenopathy:    She has no cervical adenopathy.  Neurological: She is alert and oriented to person, place, and time.  President-- "Daisy Floro, Barack Obama, Bush" 100-... Math is not her thing [id number] Recall 3/3  Skin: No rash noted. No erythema.  Psychiatric: She has a normal mood and affect. Her behavior is normal.          Assessment & Plan:

## 2016-05-03 NOTE — Assessment & Plan Note (Signed)
See social history Blank forms given 

## 2016-05-03 NOTE — Assessment & Plan Note (Signed)
Seems euthryoid Will check labs 

## 2016-05-03 NOTE — Assessment & Plan Note (Signed)
I have personally reviewed the Medicare Annual Wellness questionnaire and have noted 1. The patient's medical and social history 2. Their use of alcohol, tobacco or illicit drugs 3. Their current medications and supplements 4. The patient's functional ability including ADL's, fall risks, home safety risks and hearing or visual             impairment. 5. Diet and physical activities 6. Evidence for depression or mood disorders  The patients weight, height, BMI and visual acuity have been recorded in the chart I have made referrals, counseling and provided education to the patient based review of the above and I have provided the pt with a written personalized care plan for preventive services.  I have provided you with a copy of your personalized plan for preventive services. Please take the time to review along with your updated medication list.  Will give prevnar-- VIS given and discussed Doesn't want flu vaccine Will set back up with Dr Jamal Collin for breast exam Colon due 2020 Discussed exercise, etc

## 2016-05-03 NOTE — Assessment & Plan Note (Signed)
Lab Results  Component Value Date   LDLCALC 125 (H) 12/10/2014   No ready for treatment

## 2016-05-03 NOTE — Assessment & Plan Note (Signed)
BP Readings from Last 3 Encounters:  05/03/16 (!) 150/94  12/10/14 140/80  07/22/14 (!) 160/80   Repeat 162/88 on right Suggested another medication--she doesn't want DASH plan and exercise Recheck 3 months

## 2016-05-03 NOTE — Addendum Note (Signed)
Addended by: Pilar Grammes on: 05/03/2016 04:30 PM   Modules accepted: Orders

## 2016-05-04 ENCOUNTER — Other Ambulatory Visit: Payer: Self-pay

## 2016-05-04 DIAGNOSIS — Z1231 Encounter for screening mammogram for malignant neoplasm of breast: Secondary | ICD-10-CM

## 2016-05-04 LAB — CBC WITH DIFFERENTIAL/PLATELET
BASOS ABS: 0.1 10*3/uL (ref 0.0–0.1)
Basophils Relative: 0.9 % (ref 0.0–3.0)
EOS PCT: 2.2 % (ref 0.0–5.0)
Eosinophils Absolute: 0.2 10*3/uL (ref 0.0–0.7)
HEMATOCRIT: 40.7 % (ref 36.0–46.0)
Hemoglobin: 13.1 g/dL (ref 12.0–15.0)
LYMPHS ABS: 2.3 10*3/uL (ref 0.7–4.0)
LYMPHS PCT: 25.4 % (ref 12.0–46.0)
MCHC: 32.1 g/dL (ref 30.0–36.0)
MCV: 81.6 fl (ref 78.0–100.0)
MONOS PCT: 4.2 % (ref 3.0–12.0)
Monocytes Absolute: 0.4 10*3/uL (ref 0.1–1.0)
NEUTROS ABS: 6.1 10*3/uL (ref 1.4–7.7)
Neutrophils Relative %: 67.3 % (ref 43.0–77.0)
PLATELETS: 234 10*3/uL (ref 150.0–400.0)
RBC: 4.98 Mil/uL (ref 3.87–5.11)
RDW: 15.7 % — ABNORMAL HIGH (ref 11.5–15.5)
WBC: 9.1 10*3/uL (ref 4.0–10.5)

## 2016-05-04 LAB — COMPREHENSIVE METABOLIC PANEL
ALT: 11 U/L (ref 0–35)
AST: 13 U/L (ref 0–37)
Albumin: 4.1 g/dL (ref 3.5–5.2)
Alkaline Phosphatase: 43 U/L (ref 39–117)
BILIRUBIN TOTAL: 0.3 mg/dL (ref 0.2–1.2)
BUN: 19 mg/dL (ref 6–23)
CALCIUM: 9.4 mg/dL (ref 8.4–10.5)
CHLORIDE: 102 meq/L (ref 96–112)
CO2: 30 meq/L (ref 19–32)
Creatinine, Ser: 0.95 mg/dL (ref 0.40–1.20)
GFR: 75.87 mL/min (ref >60.00)
GLUCOSE: 86 mg/dL (ref 70–99)
POTASSIUM: 3.8 meq/L (ref 3.5–5.1)
Sodium: 141 mEq/L (ref 135–145)
Total Protein: 7.7 g/dL (ref 6.0–8.3)

## 2016-05-04 LAB — TSH: TSH: 0.72 u[IU]/mL (ref 0.35–4.50)

## 2016-05-04 LAB — LIPID PANEL
CHOL/HDL RATIO: 4
Cholesterol: 198 mg/dL (ref 0–200)
HDL: 49.9 mg/dL (ref >39.00)
LDL CALC: 121 mg/dL — AB (ref 0–99)
NONHDL: 147.66
TRIGLYCERIDES: 135 mg/dL (ref 0.0–149.0)
VLDL: 27 mg/dL (ref 0.0–40.0)

## 2016-05-04 LAB — T4, FREE: Free T4: 0.86 ng/dL (ref 0.60–1.60)

## 2016-05-17 ENCOUNTER — Ambulatory Visit: Attending: General Surgery

## 2016-05-17 ENCOUNTER — Encounter: Payer: Self-pay | Admitting: *Deleted

## 2016-05-22 ENCOUNTER — Telehealth: Payer: Self-pay | Admitting: *Deleted

## 2016-05-22 NOTE — Telephone Encounter (Signed)
Left patient message to call the office to reschedule appointment with Dr. Jamal Collin. Patient was a no show for her mammogram.

## 2016-05-23 ENCOUNTER — Ambulatory Visit: Payer: Self-pay | Admitting: General Surgery

## 2016-07-15 ENCOUNTER — Other Ambulatory Visit: Payer: Self-pay | Admitting: Internal Medicine

## 2016-08-01 ENCOUNTER — Encounter: Payer: Self-pay | Admitting: Internal Medicine

## 2016-08-01 ENCOUNTER — Ambulatory Visit (INDEPENDENT_AMBULATORY_CARE_PROVIDER_SITE_OTHER): Payer: Medicare Other | Admitting: Internal Medicine

## 2016-08-01 VITALS — BP 152/102 | HR 60 | Temp 98.5°F | Wt 216.0 lb

## 2016-08-01 DIAGNOSIS — I10 Essential (primary) hypertension: Secondary | ICD-10-CM

## 2016-08-01 MED ORDER — CLONIDINE HCL 0.2 MG/24HR TD PTWK: 0.2000 mg | MEDICATED_PATCH | TRANSDERMAL | 12 refills | Status: DC

## 2016-08-01 NOTE — Assessment & Plan Note (Signed)
Has resistant HTN with history of early CVAs in parents Urged her to try another medication and she is willing Will try clonidine patch---hydralazine if that is too expensive

## 2016-08-01 NOTE — Progress Notes (Signed)
Pre visit review using our clinic review tool, if applicable. No additional management support is needed unless otherwise documented below in the visit note. 

## 2016-08-01 NOTE — Progress Notes (Signed)
   Subjective:    Patient ID: Kristine Hammond, female    DOB: Oct 08, 1950, 66 y.o.   MRN: [id number]  HPI Here for follow up of HTN  Plans to get monitor for home use now Hasn't been checking  Feels fine No headache No chest pain, SOB, dizziness  Recent cold--using OTC meds (last 2 nights ago)  Current Outpatient Prescriptions on File Prior to Visit  Medication Sig Dispense Refill  . amLODipine (NORVASC) 10 MG tablet TAKE 1 TABLET (10 MG TOTAL) BY MOUTH DAILY. 90 tablet 2  . bisoprolol-hydrochlorothiazide (ZIAC) 10-6.25 MG tablet TAKE 1 TABLET BY MOUTH DAILY. 90 tablet 2  . losartan-hydrochlorothiazide (HYZAAR) 100-25 MG tablet TAKE 1 TABLET BY MOUTH DAILY. 90 tablet 2   No current facility-administered medications on file prior to visit.     No Known Allergies  Past Medical History:  Diagnosis Date  . Grave's disease    treated with RAI  . Hyperlipidemia   . Hypertension   . Vaginal delivery     x 2    Past Surgical History:  Procedure Laterality Date  . BREAST SURGERY Right 2014   rt breast biopsy  . BREAST SURGERY Right 2014   right breast lumpectomy  . gyn surgery     hysterectomy form fibroids    Family History  Problem Relation Age of Onset  . Stroke Mother   . Stroke Father   . Stroke Brother   . Stroke Sister   . Stroke Sister   . Cancer Maternal Grandfather     bone    Social History   Social History  . Marital status: Married    Spouse name: N/A  . Number of children: 2  . Years of education: N/A   Occupational History  . [employer]     Retired   Social History Main Topics  . Smoking status: Former Research scientist (life sciences)  . Smokeless tobacco: Never Used  . Alcohol use No  . Drug use: No  . Sexual activity: Not on file   Other Topics Concern  . Not on file   Social History Narrative   No living will--plans to do this soon   Husband to be health care POA   Would accept resuscitation   Would accept feeding tube   Review of  Systems Sleeps plenty Weight up a few pounds No exercise--but stays active (works at [employer])    Objective:   Physical Exam  Constitutional: She appears well-developed and well-nourished. No distress.  Neck: No thyromegaly present.  Cardiovascular: Normal rate, regular rhythm, normal heart sounds and intact distal pulses.  Exam reveals no gallop.   No murmur heard. Pulmonary/Chest: Effort normal and breath sounds normal. No respiratory distress. She has no wheezes. She has no rales.  Musculoskeletal: She exhibits no edema.  Lymphadenopathy:    She has no cervical adenopathy.          Assessment & Plan:

## 2016-08-01 NOTE — Patient Instructions (Signed)
Please try the new once a week patch. Let me know if it is too expensive (and I will send a different prescription). Let me know if you have any problems with the new medication.

## 2016-08-08 ENCOUNTER — Ambulatory Visit: Payer: Self-pay | Admitting: General Surgery

## 2016-09-03 ENCOUNTER — Ambulatory Visit: Payer: Self-pay | Admitting: General Surgery

## 2016-10-23 ENCOUNTER — Ambulatory Visit
Admission: RE | Admit: 2016-10-23 | Discharge: 2016-10-23 | Disposition: A | Discharge status: Home / Self Care | Payer: Medicare Other | Source: Ambulatory Visit | Attending: General Surgery | Admitting: General Surgery

## 2016-10-23 DIAGNOSIS — Z1231 Encounter for screening mammogram for malignant neoplasm of breast: Secondary | ICD-10-CM | POA: Insufficient documentation

## 2016-10-25 ENCOUNTER — Encounter: Payer: Self-pay | Admitting: *Deleted

## 2016-10-31 ENCOUNTER — Ambulatory Visit: Payer: Medicare Other | Admitting: General Surgery

## 2016-11-20 ENCOUNTER — Encounter: Payer: Self-pay | Admitting: *Deleted

## 2016-11-21 ENCOUNTER — Inpatient Hospital Stay: Admission: RE | Admit: 2016-11-21 | Payer: Medicare Other | Source: Ambulatory Visit

## 2016-12-17 ENCOUNTER — Ambulatory Visit (INDEPENDENT_AMBULATORY_CARE_PROVIDER_SITE_OTHER): Payer: Medicare Other | Admitting: Internal Medicine

## 2016-12-17 ENCOUNTER — Encounter: Payer: Self-pay | Admitting: Internal Medicine

## 2016-12-17 VITALS — BP 140/84 | HR 60 | Temp 98.1°F | Wt 216.0 lb

## 2016-12-17 DIAGNOSIS — M79601 Pain in right arm: Secondary | ICD-10-CM

## 2016-12-17 DIAGNOSIS — M79603 Pain in arm, unspecified: Secondary | ICD-10-CM | POA: Insufficient documentation

## 2016-12-17 DIAGNOSIS — M79602 Pain in left arm: Secondary | ICD-10-CM | POA: Diagnosis not present

## 2016-12-17 NOTE — Progress Notes (Signed)
   Subjective:    Patient ID: Kristine Hammond, female    DOB: [birth year], 66 y.o.   MRN: [id number]  HPI Here due to arm pain  Has noted pain in both arms---gradually comes on  (like pregnancy contractions) Then becomes more severe Isolated to forearm Started 4 days ago--mostly after eating No numbness or sensation changes Not stabbing or sharp. Not burning  No chest pain  No SOB No diaphoresis or nausea  Compression on the arms (used socks) helped BC's also helped some  Current Outpatient Prescriptions on File Prior to Visit  Medication Sig Dispense Refill  . amLODipine (NORVASC) 10 MG tablet TAKE 1 TABLET (10 MG TOTAL) BY MOUTH DAILY. 90 tablet 2  . bisoprolol-hydrochlorothiazide (ZIAC) 10-6.25 MG tablet TAKE 1 TABLET BY MOUTH DAILY. 90 tablet 2  . cloNIDine (CATAPRES - DOSED IN MG/24 HR) 0.2 mg/24hr patch Place 1 patch (0.2 mg total) onto the skin once a week. 4 patch 12  . losartan-hydrochlorothiazide (HYZAAR) 100-25 MG tablet TAKE 1 TABLET BY MOUTH DAILY. 90 tablet 2   No current facility-administered medications on file prior to visit.     No Known Allergies  Past Medical History:  Diagnosis Date  . Grave's disease    treated with RAI  . Hyperlipidemia   . Hypertension   . Vaginal delivery     x 2    Past Surgical History:  Procedure Laterality Date  . BREAST BIOPSY Right   . BREAST SURGERY Right 2014   rt breast biopsy  . gyn surgery     hysterectomy form fibroids    Family History  Problem Relation Age of Onset  . Stroke Mother   . Stroke Father   . Stroke Brother   . Stroke Sister   . Stroke Sister   . Cancer Maternal Grandfather        bone    Social History   Social History  . Marital status: Married    Spouse name: N/A  . Number of children: 2  . Years of education: N/A   Occupational History  . [employer]     Retired   Social History Main Topics  . Smoking status: Former Research scientist (life sciences)  . Smokeless tobacco: Never Used  .  Alcohol use No  . Drug use: No  . Sexual activity: Not on file   Other Topics Concern  . 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

## 2016-12-17 NOTE — Assessment & Plan Note (Signed)
Bilateral forearms Seems to be related to increased computer work since last week Likely muscular (since BC's so effective) but could be low level carpal tunnel Discussed positioning Should be decreasing the work load--and then just will be doing weekly

## 2017-03-06 DIAGNOSIS — H2511 Age-related nuclear cataract, right eye: Secondary | ICD-10-CM | POA: Diagnosis not present

## 2017-06-12 ENCOUNTER — Other Ambulatory Visit: Payer: Self-pay | Admitting: Internal Medicine

## 2017-09-12 ENCOUNTER — Ambulatory Visit (INDEPENDENT_AMBULATORY_CARE_PROVIDER_SITE_OTHER): Payer: Medicare Other | Admitting: Internal Medicine

## 2017-09-12 ENCOUNTER — Encounter: Payer: Self-pay | Admitting: Internal Medicine

## 2017-09-12 VITALS — BP 140/86 | HR 75 | Temp 98.5°F | Ht 65.25 in | Wt 213.0 lb

## 2017-09-12 DIAGNOSIS — Z7189 Other specified counseling: Secondary | ICD-10-CM | POA: Diagnosis not present

## 2017-09-12 DIAGNOSIS — E0500 Thyrotoxicosis with diffuse goiter without thyrotoxic crisis or storm: Secondary | ICD-10-CM

## 2017-09-12 DIAGNOSIS — Z6834 Body mass index (BMI) 34.0-34.9, adult: Secondary | ICD-10-CM

## 2017-09-12 DIAGNOSIS — J069 Acute upper respiratory infection, unspecified: Secondary | ICD-10-CM

## 2017-09-12 DIAGNOSIS — Z0000 Encounter for general adult medical examination without abnormal findings: Secondary | ICD-10-CM

## 2017-09-12 DIAGNOSIS — Z23 Encounter for immunization: Secondary | ICD-10-CM | POA: Diagnosis not present

## 2017-09-12 DIAGNOSIS — E669 Obesity, unspecified: Secondary | ICD-10-CM

## 2017-09-12 DIAGNOSIS — I10 Essential (primary) hypertension: Secondary | ICD-10-CM | POA: Diagnosis not present

## 2017-09-12 NOTE — Patient Instructions (Signed)
DASH Eating Plan DASH stands for "Dietary Approaches to Stop Hypertension." The DASH eating plan is a healthy eating plan that has been shown to reduce high blood pressure (hypertension). It may also reduce your risk for type 2 diabetes, heart disease, and stroke. The DASH eating plan may also help with weight loss. What are tips for following this plan? General guidelines  Avoid eating more than 2,300 mg (milligrams) of salt (sodium) a day. If you have hypertension, you may need to reduce your sodium intake to 1,500 mg a day.  Limit alcohol intake to no more than 1 drink a day for nonpregnant women and 2 drinks a day for men. One drink equals 12 oz of beer, 5 oz of wine, or 1 oz of hard liquor.  Work with your health care provider to maintain a healthy body weight or to lose weight. Ask what an ideal weight is for you.  Get at least 30 minutes of exercise that causes your heart to beat faster (aerobic exercise) most days of the week. Activities may include walking, swimming, or biking.  Work with your health care provider or diet and nutrition specialist (dietitian) to adjust your eating plan to your individual calorie needs. Reading food labels  Check food labels for the amount of sodium per serving. Choose foods with less than 5 percent of the Daily Value of sodium. Generally, foods with less than 300 mg of sodium per serving fit into this eating plan.  To find whole grains, look for the word "whole" as the first word in the ingredient list. Shopping  Buy products labeled as "low-sodium" or "no salt added."  Buy fresh foods. Avoid canned foods and premade or frozen meals. Cooking  Avoid adding salt when cooking. Use salt-free seasonings or herbs instead of table salt or sea salt. Check with your health care provider or pharmacist before using salt substitutes.  Do not fry foods. Cook foods using healthy methods such as baking, boiling, grilling, and broiling instead.  Cook with  heart-healthy oils, such as olive, canola, soybean, or sunflower oil. Meal planning   Eat a balanced diet that includes: ? 5 or more servings of fruits and vegetables each day. At each meal, try to fill half of your plate with fruits and vegetables. ? Up to 6-8 servings of whole grains each day. ? Less than 6 oz of lean meat, poultry, or fish each day. A 3-oz serving of meat is about the same size as a deck of cards. One egg equals 1 oz. ? 2 servings of low-fat dairy each day. ? A serving of nuts, seeds, or beans 5 times each week. ? Heart-healthy fats. Healthy fats called Omega-3 fatty acids are found in foods such as flaxseeds and coldwater fish, like sardines, salmon, and mackerel.  Limit how much you eat of the following: ? Canned or prepackaged foods. ? Food that is high in trans fat, such as fried foods. ? Food that is high in saturated fat, such as fatty meat. ? Sweets, desserts, sugary drinks, and other foods with added sugar. ? Full-fat dairy products.  Do not salt foods before eating.  Try to eat at least 2 vegetarian meals each week.  Eat more home-cooked food and less restaurant, buffet, and fast food.  When eating at a restaurant, ask that your food be prepared with less salt or no salt, if possible. What foods are recommended? The items listed may not be a complete list. Talk with your dietitian about what   dietary choices are best for you. Grains Whole-grain or whole-wheat bread. Whole-grain or whole-wheat pasta. Brown rice. Oatmeal. Quinoa. Bulgur. Whole-grain and low-sodium cereals. Pita bread. Low-fat, low-sodium crackers. Whole-wheat flour tortillas. Vegetables Fresh or frozen vegetables (raw, steamed, roasted, or grilled). Low-sodium or reduced-sodium tomato and vegetable juice. Low-sodium or reduced-sodium tomato sauce and tomato paste. Low-sodium or reduced-sodium canned vegetables. Fruits All fresh, dried, or frozen fruit. Canned fruit in natural juice (without  added sugar). Meat and other protein foods Skinless chicken or turkey. Ground chicken or turkey. Pork with fat trimmed off. Fish and seafood. Egg whites. Dried beans, peas, or lentils. Unsalted nuts, nut butters, and seeds. Unsalted canned beans. Lean cuts of beef with fat trimmed off. Low-sodium, lean deli meat. Dairy Low-fat (1%) or fat-free (skim) milk. Fat-free, low-fat, or reduced-fat cheeses. Nonfat, low-sodium ricotta or cottage cheese. Low-fat or nonfat yogurt. Low-fat, low-sodium cheese. Fats and oils Soft margarine without trans fats. Vegetable oil. Low-fat, reduced-fat, or light mayonnaise and salad dressings (reduced-sodium). Canola, safflower, olive, soybean, and sunflower oils. Avocado. Seasoning and other foods Herbs. Spices. Seasoning mixes without salt. Unsalted popcorn and pretzels. Fat-free sweets. What foods are not recommended? The items listed may not be a complete list. Talk with your dietitian about what dietary choices are best for you. Grains Baked goods made with fat, such as croissants, muffins, or some breads. Dry pasta or rice meal packs. Vegetables Creamed or fried vegetables. Vegetables in a cheese sauce. Regular canned vegetables (not low-sodium or reduced-sodium). Regular canned tomato sauce and paste (not low-sodium or reduced-sodium). Regular tomato and vegetable juice (not low-sodium or reduced-sodium). Pickles. Olives. Fruits Canned fruit in a light or heavy syrup. Fried fruit. Fruit in cream or butter sauce. Meat and other protein foods Fatty cuts of meat. Ribs. Fried meat. Bacon. Sausage. Bologna and other processed lunch meats. Salami. Fatback. Hotdogs. Bratwurst. Salted nuts and seeds. Canned beans with added salt. Canned or smoked fish. Whole eggs or egg yolks. Chicken or turkey with skin. Dairy Whole or 2% milk, cream, and half-and-half. Whole or full-fat cream cheese. Whole-fat or sweetened yogurt. Full-fat cheese. Nondairy creamers. Whipped toppings.  Processed cheese and cheese spreads. Fats and oils Butter. Stick margarine. Lard. Shortening. Ghee. Bacon fat. Tropical oils, such as coconut, palm kernel, or palm oil. Seasoning and other foods Salted popcorn and pretzels. Onion salt, garlic salt, seasoned salt, table salt, and sea salt. Worcestershire sauce. Tartar sauce. Barbecue sauce. Teriyaki sauce. Soy sauce, including reduced-sodium. Steak sauce. Canned and packaged gravies. Fish sauce. Oyster sauce. Cocktail sauce. Horseradish that you find on the shelf. Ketchup. Mustard. Meat flavorings and tenderizers. Bouillon cubes. Hot sauce and Tabasco sauce. Premade or packaged marinades. Premade or packaged taco seasonings. Relishes. Regular salad dressings. Where to find more information:  National Heart, Lung, and Blood Institute: www.nhlbi.nih.gov  American Heart Association: www.heart.org Summary  The DASH eating plan is a healthy eating plan that has been shown to reduce high blood pressure (hypertension). It may also reduce your risk for type 2 diabetes, heart disease, and stroke.  With the DASH eating plan, you should limit salt (sodium) intake to 2,300 mg a day. If you have hypertension, you may need to reduce your sodium intake to 1,500 mg a day.  When on the DASH eating plan, aim to eat more fresh fruits and vegetables, whole grains, lean proteins, low-fat dairy, and heart-healthy fats.  Work with your health care provider or diet and nutrition specialist (dietitian) to adjust your eating plan to your individual   calorie needs. This information is not intended to replace advice given to you by your health care provider. Make sure you discuss any questions you have with your health care provider. Document Released: 04/12/2011 Document Revised: 04/16/2016 Document Reviewed: 04/16/2016 Elsevier Interactive Patient Education  2018 [employer]  

## 2017-09-12 NOTE — Assessment & Plan Note (Signed)
See social history 

## 2017-09-12 NOTE — Assessment & Plan Note (Signed)
Seems self limited Discussed symptomatic Rx

## 2017-09-12 NOTE — Addendum Note (Signed)
Addended by: Modena Nunnery on: 09/12/2017 05:01 PM   Modules accepted: Orders

## 2017-09-12 NOTE — Assessment & Plan Note (Signed)
DASH info Exercise discussed Goal is 10# loss in next year

## 2017-09-12 NOTE — Assessment & Plan Note (Signed)
Still seems euthyroid Will recheck labs to be sure no secondary hypothyroidism after RAI

## 2017-09-12 NOTE — Progress Notes (Signed)
Subjective:    Patient ID: Kristine Hammond, female    DOB: 1951-02-18, 67 y.o.   MRN: [id number]  HPI Here for Medicare wellness visit and follow up of chronic health conditions Reviewed form and advanced directives Reviewed other doctors No alcohol or tobacco Still no exercise --but plans to start [place] and hearing are fine No falls No depression or anhedonia Independent with instrumental ADLs No sig memory issues  Has mild cold now Nothing she would have come in for Mild congestion in head and chest No SOB No fever  Wasn't able to tolerate the clonidine patch Still on other medications No headache No chest pain No SOB No dizziness or syncope Only mild edema at times No palpitations  No thyroid problems Weight stable No energy problems  Current Outpatient Medications on File Prior to Visit  Medication Sig Dispense Refill  . amLODipine (NORVASC) 10 MG tablet TAKE 1 TABLET (10 MG TOTAL) BY MOUTH DAILY. 90 tablet 0  . bisoprolol-hydrochlorothiazide (ZIAC) 10-6.25 MG tablet TAKE 1 TABLET BY MOUTH DAILY. 90 tablet 0  . losartan-hydrochlorothiazide (HYZAAR) 100-25 MG tablet TAKE 1 TABLET BY MOUTH DAILY. 90 tablet 0   No current facility-administered medications on file prior to visit.     No Known Allergies  Past Medical History:  Diagnosis Date  . Grave's disease    treated with RAI  . Hyperlipidemia   . Hypertension   . Vaginal delivery     x 2    Past Surgical History:  Procedure Laterality Date  . BREAST BIOPSY Right   . BREAST SURGERY Right 2014   rt breast biopsy  . gyn surgery     hysterectomy form fibroids    Family History  Problem Relation Age of Onset  . Stroke Mother   . Stroke Father   . Stroke Brother   . Stroke Sister   . Stroke Sister   . Cancer Maternal Grandfather        bone    Social History   Socioeconomic History  . Marital status: Married    Spouse name: Not on file  . Number of children: 2  . Years  of education: Not on file  . Highest education level: Not on file  Occupational History  . Occupation: [employer]    Comment: Retired  Scientific laboratory technician  . Financial resource strain: Not on file  . Food insecurity:    Worry: Not on file    Inability: Not on file  . Transportation needs:    Medical: Not on file    Non-medical: Not on file  Tobacco Use  . Smoking status: Former Research scientist (life sciences)  . Smokeless tobacco: Never Used  Substance and Sexual Activity  . Alcohol use: No  . Drug use: No  . Sexual activity: Not on file  Lifestyle  . Physical activity:    Days per week: Not on file    Minutes per session: Not on file  . Stress: Not on file  Relationships  . Social connections:    Talks on phone: Not on file    Gets together: Not on file    Attends religious service: Not on file    Active member of club or organization: Not on file    Attends meetings of clubs or organizations: Not on file    Relationship status: Not on file  . Intimate partner violence:    Fear of current or ex partner: Not on file    Emotionally abused:  Not on file    Physically abused: Not on file    Forced sexual activity: Not on file  Other Topics Concern  . Not on file  Social History Narrative   No living will--plans to do this soon   Husband to be health care POA-- alternate is daughters   Would accept resuscitation   Would accept feeding tube   Review of Systems Sleeps well Appetite is fine Weight is stable Wears seat belt Teeth are fine. Keeps up with dentist No skin rash or suspicious lesions No longer gets breast exams Bowels are fine--no blood No heartburn or dysphagia Voids okay---some stress incontinence. Wears pad at times No sig back or joint    Objective:   Physical Exam  Constitutional: She is oriented to person, place, and time. She appears well-developed. No distress.  HENT:  Mouth/Throat: Oropharynx is clear and moist. No oropharyngeal exudate.  Neck: No thyromegaly  present.  Cardiovascular: Normal rate, regular rhythm, normal heart sounds and intact distal pulses. Exam reveals no friction rub.  No murmur heard. Pulmonary/Chest: Effort normal and breath sounds normal. No respiratory distress. She has no wheezes. She has no rales.  Abdominal: Soft. She exhibits no distension. There is no tenderness.  Musculoskeletal: She exhibits no tenderness.  Trace ankle edema  Lymphadenopathy:    She has no cervical adenopathy.  Neurological: She is alert and oriented to person, place, and time.  President--- "Daisy Floro, Barack Obama, ?" [phone] D-l-r-o-w Recall 3/3  Skin: Skin is warm. No rash noted.  Psychiatric: She has a normal mood and affect. Her behavior is normal.          Assessment & Plan:

## 2017-09-12 NOTE — Assessment & Plan Note (Addendum)
I have personally reviewed the Medicare Annual Wellness questionnaire and have noted 1. The patient's medical and social history 2. Their use of alcohol, tobacco or illicit drugs 3. Their current medications and supplements 4. The patient's functional ability including ADL's, fall risks, home safety risks and hearing or visual             impairment. 5. Diet and physical activities 6. Evidence for depression or mood disorders  The patients weight, height, BMI and visual acuity have been recorded in the chart I have made referrals, counseling and provided education to the patient based review of the above and I have provided the pt with a written personalized care plan for preventive services.  I have provided you with a copy of your personalized plan for preventive services. Please take the time to review along with your updated medication list.  Will be due for colon next year Will decide about mammogram next month or can wait for another year No Pap due to age Discussed DASH eating and exercise Recommended yearly flu vaccine--she prefers not Pneumovax today

## 2017-09-12 NOTE — Progress Notes (Signed)
Hearing Screening   125Hz 250Hz 500Hz 1000Hz 2000Hz 3000Hz 4000Hz 6000Hz 8000Hz  Right ear:   20 20 20  20    Left ear:   20 20 20  20      Visual Acuity Screening   Right eye Left eye Both eyes  Without correction: 20/20 20/20 20/20  With correction:       

## 2017-09-12 NOTE — Assessment & Plan Note (Signed)
BP Readings from Last 3 Encounters:  09/12/17 140/86  12/17/16 140/84  08/01/16 (!) 152/102   Okay now without the patch Will check labs Discussed lifestyle measures

## 2017-09-13 LAB — CBC
HCT: 39.4 % (ref 36.0–46.0)
Hemoglobin: 12.5 g/dL (ref 12.0–15.0)
MCHC: 31.8 g/dL (ref 30.0–36.0)
MCV: 82.1 fl (ref 78.0–100.0)
Platelets: 201 10*3/uL (ref 150.0–400.0)
RBC: 4.79 Mil/uL (ref 3.87–5.11)
RDW: 16.4 % — AB (ref 11.5–15.5)
WBC: 9 10*3/uL (ref 4.0–10.5)

## 2017-09-13 LAB — COMPREHENSIVE METABOLIC PANEL
ALBUMIN: 3.9 g/dL (ref 3.5–5.2)
ALK PHOS: 34 U/L — AB (ref 39–117)
ALT: 12 U/L (ref 0–35)
AST: 15 U/L (ref 0–37)
BILIRUBIN TOTAL: 0.2 mg/dL (ref 0.2–1.2)
BUN: 11 mg/dL (ref 6–23)
CO2: 31 mEq/L (ref 19–32)
Calcium: 9.3 mg/dL (ref 8.4–10.5)
Chloride: 102 mEq/L (ref 96–112)
Creatinine, Ser: 0.79 mg/dL (ref 0.40–1.20)
GFR: 93.47 mL/min (ref >60.00)
Glucose, Bld: 82 mg/dL (ref 70–99)
POTASSIUM: 4.2 meq/L (ref 3.5–5.1)
Sodium: 142 mEq/L (ref 135–145)
TOTAL PROTEIN: 7.7 g/dL (ref 6.0–8.3)

## 2017-09-13 LAB — T4, FREE: FREE T4: 0.75 ng/dL (ref 0.60–1.60)

## 2017-09-13 LAB — TSH: TSH: 0.85 u[IU]/mL (ref 0.35–4.50)

## 2017-09-27 ENCOUNTER — Other Ambulatory Visit: Payer: Self-pay

## 2017-09-27 NOTE — Patient Outreach (Signed)
[place] [facility]) Care Management  09/27/2017  Kristine Hammond 1950-08-29 [id number]   Medication Adherence call to Mrs. Kristine Hammond patient's telephone number does not take calls from any outside number patient is due on Losartan/Hctz 100/25 mg under Kristine Hammond.  [place] Management Direct Dial [phone]  Fax [phone] [email or web address]

## 2017-10-01 ENCOUNTER — Other Ambulatory Visit: Payer: Self-pay | Admitting: Internal Medicine

## 2017-12-04 ENCOUNTER — Other Ambulatory Visit: Payer: Self-pay | Admitting: Internal Medicine

## 2018-03-13 ENCOUNTER — Other Ambulatory Visit: Payer: Self-pay

## 2018-03-13 NOTE — Patient Outreach (Signed)
[place] [facility]) Care Management  03/13/2018  Kristine Hammond Mar 06, 1951 [id number]   Medication Adherence call to Kristine Hammond left a message for patient to call back patient is due on Losartan/ HCTZ 100/25 mg Kristine Hammond is showing past due under [place].   [place] Management Direct Dial [phone]  Fax [phone] [name]

## 2018-09-17 ENCOUNTER — Encounter: Payer: Self-pay | Admitting: Internal Medicine

## 2018-11-10 ENCOUNTER — Other Ambulatory Visit: Payer: Self-pay | Admitting: Internal Medicine

## 2019-02-04 ENCOUNTER — Other Ambulatory Visit: Payer: Self-pay | Admitting: Internal Medicine

## 2019-04-10 ENCOUNTER — Other Ambulatory Visit: Payer: Self-pay

## 2019-04-10 ENCOUNTER — Ambulatory Visit (INDEPENDENT_AMBULATORY_CARE_PROVIDER_SITE_OTHER): Payer: Medicare Other | Admitting: Internal Medicine

## 2019-04-10 ENCOUNTER — Encounter: Payer: Self-pay | Admitting: Internal Medicine

## 2019-04-10 VITALS — BP 138/86 | HR 65 | Temp 98.2°F | Ht 65.0 in | Wt 206.0 lb

## 2019-04-10 DIAGNOSIS — Z6834 Body mass index (BMI) 34.0-34.9, adult: Secondary | ICD-10-CM

## 2019-04-10 DIAGNOSIS — Z0000 Encounter for general adult medical examination without abnormal findings: Secondary | ICD-10-CM

## 2019-04-10 DIAGNOSIS — E0500 Thyrotoxicosis with diffuse goiter without thyrotoxic crisis or storm: Secondary | ICD-10-CM

## 2019-04-10 DIAGNOSIS — E662 Morbid (severe) obesity with alveolar hypoventilation: Secondary | ICD-10-CM

## 2019-04-10 DIAGNOSIS — Z23 Encounter for immunization: Secondary | ICD-10-CM

## 2019-04-10 DIAGNOSIS — Z7189 Other specified counseling: Secondary | ICD-10-CM

## 2019-04-10 DIAGNOSIS — I10 Essential (primary) hypertension: Secondary | ICD-10-CM

## 2019-04-10 LAB — COMPREHENSIVE METABOLIC PANEL
ALT: 14 U/L (ref 0–35)
AST: 16 U/L (ref 0–37)
Albumin: 4.1 g/dL (ref 3.5–5.2)
Alkaline Phosphatase: 59 U/L (ref 39–117)
BUN: 10 mg/dL (ref 6–23)
CO2: 30 mEq/L (ref 19–32)
Calcium: 9.4 mg/dL (ref 8.4–10.5)
Chloride: 102 mEq/L (ref 96–112)
Creatinine, Ser: 0.7 mg/dL (ref 0.40–1.20)
GFR: 100.64 mL/min (ref >60.00)
Glucose, Bld: 85 mg/dL (ref 70–99)
Potassium: 3.6 mEq/L (ref 3.5–5.1)
Sodium: 141 mEq/L (ref 135–145)
Total Bilirubin: 0.3 mg/dL (ref 0.2–1.2)
Total Protein: 7.8 g/dL (ref 6.0–8.3)

## 2019-04-10 LAB — CBC
HCT: 41.9 % (ref 36.0–46.0)
Hemoglobin: 13.2 g/dL (ref 12.0–15.0)
MCHC: 31.5 g/dL (ref 30.0–36.0)
MCV: 82.4 fl (ref 78.0–100.0)
Platelets: 210 10*3/uL (ref 150.0–400.0)
RBC: 5.08 Mil/uL (ref 3.87–5.11)
RDW: 16 % — ABNORMAL HIGH (ref 11.5–15.5)
WBC: 6.3 10*3/uL (ref 4.0–10.5)

## 2019-04-10 LAB — T4, FREE: Free T4: 0.91 ng/dL (ref 0.60–1.60)

## 2019-04-10 LAB — TSH: TSH: 1.13 u[IU]/mL (ref 0.35–4.50)

## 2019-04-10 NOTE — Assessment & Plan Note (Signed)
BP Readings from Last 3 Encounters:  04/10/19 138/86  09/12/17 140/86  12/17/16 140/84   Good control Single agent now

## 2019-04-10 NOTE — Assessment & Plan Note (Signed)
See social history 

## 2019-04-10 NOTE — Addendum Note (Signed)
Addended by: Pilar Grammes on: 04/10/2019 12:15 PM   Modules accepted: Orders

## 2019-04-10 NOTE — Progress Notes (Signed)
Hearing Screening   Method: Audiometry   125Hz  250Hz  500Hz  1000Hz  2000Hz  3000Hz  4000Hz  6000Hz  8000Hz   Right ear:   40 40 20  40    Left ear:   20 20 20  20       Visual Acuity Screening   Right eye Left eye Both eyes  Without correction: 20/25 20/25 20/25   With correction:

## 2019-04-10 NOTE — Patient Instructions (Addendum)
Please set up your screening mammogram.   DASH Eating Plan DASH stands for "Dietary Approaches to Stop Hypertension." The DASH eating plan is a healthy eating plan that has been shown to reduce high blood pressure (hypertension). It may also reduce your risk for type 2 diabetes, heart disease, and stroke. The DASH eating plan may also help with weight loss. What are tips for following this plan?  General guidelines  Avoid eating more than 2,300 mg (milligrams) of salt (sodium) a day. If you have hypertension, you may need to reduce your sodium intake to 1,500 mg a day.  Limit alcohol intake to no more than 1 drink a day for nonpregnant women and 2 drinks a day for men. One drink equals 12 oz of beer, 5 oz of wine, or 1 oz of hard liquor.  Work with your health care provider to maintain a healthy body weight or to lose weight. Ask what an ideal weight is for you.  Get at least 30 minutes of exercise that causes your heart to beat faster (aerobic exercise) most days of the week. Activities may include walking, swimming, or biking.  Work with your health care provider or diet and nutrition specialist (dietitian) to adjust your eating plan to your individual calorie needs. Reading food labels   Check food labels for the amount of sodium per serving. Choose foods with less than 5 percent of the Daily Value of sodium. Generally, foods with less than 300 mg of sodium per serving fit into this eating plan.  To find whole grains, look for the word "whole" as the first word in the ingredient list. Shopping  Buy products labeled as "low-sodium" or "no salt added."  Buy fresh foods. Avoid canned foods and premade or frozen meals. Cooking  Avoid adding salt when cooking. Use salt-free seasonings or herbs instead of table salt or sea salt. Check with your health care provider or pharmacist before using salt substitutes.  Do not fry foods. Cook foods using healthy methods such as baking, boiling,  grilling, and broiling instead.  Cook with heart-healthy oils, such as olive, canola, soybean, or sunflower oil. Meal planning  Eat a balanced diet that includes: ? 5 or more servings of fruits and vegetables each day. At each meal, try to fill half of your plate with fruits and vegetables. ? Up to 6-8 servings of whole grains each day. ? Less than 6 oz of lean meat, poultry, or fish each day. A 3-oz serving of meat is about the same size as a deck of cards. One egg equals 1 oz. ? 2 servings of low-fat dairy each day. ? A serving of nuts, seeds, or beans 5 times each week. ? Heart-healthy fats. Healthy fats called Omega-3 fatty acids are found in foods such as flaxseeds and coldwater fish, like sardines, salmon, and mackerel.  Limit how much you eat of the following: ? Canned or prepackaged foods. ? Food that is high in trans fat, such as fried foods. ? Food that is high in saturated fat, such as fatty meat. ? Sweets, desserts, sugary drinks, and other foods with added sugar. ? Full-fat dairy products.  Do not salt foods before eating.  Try to eat at least 2 vegetarian meals each week.  Eat more home-cooked food and less restaurant, buffet, and fast food.  When eating at a restaurant, ask that your food be prepared with less salt or no salt, if possible. What foods are recommended? The items listed may not be  a complete list. Talk with your dietitian about what dietary choices are best for you. Grains Whole-grain or whole-wheat bread. Whole-grain or whole-wheat pasta. Brown rice. [place]. Bulgur. Whole-grain and low-sodium cereals. Pita bread. Low-fat, low-sodium crackers. Whole-wheat flour tortillas. Vegetables Fresh or frozen vegetables (raw, steamed, roasted, or grilled). Low-sodium or reduced-sodium tomato and vegetable juice. Low-sodium or reduced-sodium tomato sauce and tomato paste. Low-sodium or reduced-sodium canned vegetables. Fruits All fresh, dried, or frozen  fruit. Canned fruit in natural juice (without added sugar). Meat and other protein foods Skinless chicken or [place]. Ground chicken or [place]. Pork with fat trimmed off. Fish and seafood. Egg whites. Dried beans, peas, or lentils. Unsalted nuts, nut butters, and seeds. Unsalted canned beans. Lean cuts of beef with fat trimmed off. Low-sodium, lean deli meat. Dairy Low-fat (1%) or fat-free (skim) milk. Fat-free, low-fat, or reduced-fat cheeses. Nonfat, low-sodium ricotta or cottage cheese. Low-fat or nonfat yogurt. Low-fat, low-sodium cheese. Fats and oils Soft margarine without trans fats. Vegetable oil. Low-fat, reduced-fat, or light mayonnaise and salad dressings (reduced-sodium). Canola, safflower, olive, soybean, and sunflower oils. Avocado. Seasoning and other foods Herbs. Spices. Seasoning mixes without salt. Unsalted popcorn and pretzels. Fat-free sweets. What foods are not recommended? The items listed may not be a complete list. Talk with your dietitian about what dietary choices are best for you. Grains Baked goods made with fat, such as croissants, muffins, or some breads. Dry pasta or rice meal packs. Vegetables Creamed or fried vegetables. Vegetables in a cheese sauce. Regular canned vegetables (not low-sodium or reduced-sodium). Regular canned tomato sauce and paste (not low-sodium or reduced-sodium). Regular tomato and vegetable juice (not low-sodium or reduced-sodium). Angie Fava. Olives. Fruits Canned fruit in a light or heavy syrup. Fried fruit. Fruit in cream or butter sauce. Meat and other protein foods Fatty cuts of meat. Ribs. Fried meat. Berniece Salines. Sausage. Bologna and other processed lunch meats. Salami. Fatback. Hotdogs. Bratwurst. Salted nuts and seeds. Canned beans with added salt. Canned or smoked fish. Whole eggs or egg yolks. Chicken or [place] with skin. Dairy Whole or 2% milk, cream, and half-and-half. Whole or full-fat cream cheese. Whole-fat or sweetened yogurt. Full-fat  cheese. Nondairy creamers. Whipped toppings. Processed cheese and cheese spreads. Fats and oils Butter. Stick margarine. Lard. Shortening. Ghee. Bacon fat. Tropical oils, such as coconut, palm kernel, or palm oil. Seasoning and other foods Salted popcorn and pretzels. Onion salt, garlic salt, seasoned salt, table salt, and sea salt. Worcestershire sauce. Tartar sauce. Barbecue sauce. Teriyaki sauce. Soy sauce, including reduced-sodium. Steak sauce. Canned and packaged gravies. Fish sauce. Oyster sauce. Cocktail sauce. Horseradish that you find on the shelf. Ketchup. Mustard. Meat flavorings and tenderizers. Bouillon cubes. Hot sauce and Tabasco sauce. Premade or packaged marinades. Premade or packaged taco seasonings. Relishes. Regular salad dressings. Where to find more information:  National Heart, Lung, and [place]: https://wilson-eaton.com/  American Heart Association: www.heart.org Summary  The DASH eating plan is a healthy eating plan that has been shown to reduce high blood pressure (hypertension). It may also reduce your risk for type 2 diabetes, heart disease, and stroke.  With the DASH eating plan, you should limit salt (sodium) intake to 2,300 mg a day. If you have hypertension, you may need to reduce your sodium intake to 1,500 mg a day.  When on the DASH eating plan, aim to eat more fresh fruits and vegetables, whole grains, lean proteins, low-fat dairy, and heart-healthy fats.  Work with your health care provider or diet and nutrition specialist (  dietitian) to adjust your eating plan to your individual calorie needs. This information is not intended to replace advice given to you by your health care provider. Make sure you discuss any questions you have with your health care provider. Document Released: 04/12/2011 Document Revised: 04/05/2017 Document Reviewed: 04/16/2016 Elsevier Patient Education  2020 [employer].

## 2019-04-10 NOTE — Assessment & Plan Note (Signed)
I have personally reviewed the Medicare Annual Wellness questionnaire and have noted 1. The patient's medical and social history 2. Their use of alcohol, tobacco or illicit drugs 3. Their current medications and supplements 4. The patient's functional ability including ADL's, fall risks, home safety risks and hearing or visual             impairment. 5. Diet and physical activities 6. Evidence for depression or mood disorders  The patients weight, height, BMI and visual acuity have been recorded in the chart I have made referrals, counseling and provided education to the patient based review of the above and I have provided the pt with a written personalized care plan for preventive services.  I have provided you with a copy of your personalized plan for preventive services. Please take the time to review along with your updated medication list.  Will take flu vaccine!! Td here if any injury Due for colon---contacted Dr Carlean Purl about this Set up screening mammogram Consider shingrix eventually

## 2019-04-10 NOTE — Assessment & Plan Note (Signed)
Past RAI Will recheck labs but seems euthyroid

## 2019-04-10 NOTE — Assessment & Plan Note (Signed)
Weight down slightly Discussed healthy behaviors

## 2019-04-10 NOTE — Progress Notes (Signed)
Subjective:    Patient ID: Kristine Hammond, female    DOB: 1951/01/09, 68 y.o.   MRN: [id number]  HPI Here for Medicare wellness visit and follow up of chronic health conditions  This visit occurred during the SARS-CoV-2 public health emergency.  Safety protocols were in place, including screening questions prior to the visit, additional usage of staff PPE, and extensive cleaning of exam room while observing appropriate contact time as indicated for disinfecting solutions.   Reviewed form and advanced directives Reviewed other doctors No alcohol or tobacco Still no exercise--discussed Vision and hearing are fine No falls No depression or anhedonia Independent with instrumental ADLs No sig memory issues  Doing okay with COVID Mostly stays at home---shops for food/meds only No mood issues  No chest pain or SOB No dizziness or syncope Only taking the 1 medication Occasional mild ankle edema No palpitations of note---rarely will "hear it beating"  Energy levels okay No change in hair or skin  Current Outpatient Medications on File Prior to Visit  Medication Sig Dispense Refill  . amLODipine (NORVASC) 10 MG tablet TAKE 1 TABLET (10 MG TOTAL) BY MOUTH DAILY. NEEDS OFFICE OR VIRTUAL VISIT 30 tablet 1   No current facility-administered medications on file prior to visit.     No Known Allergies  Past Medical History:  Diagnosis Date  . Grave's disease    treated with RAI  . Hyperlipidemia   . Hypertension   . Vaginal delivery     x 2    Past Surgical History:  Procedure Laterality Date  . BREAST BIOPSY Right   . BREAST SURGERY Right 2014   rt breast biopsy  . gyn surgery     hysterectomy form fibroids    Family History  Problem Relation Age of Onset  . Stroke Mother   . Stroke Father   . Stroke Brother   . Stroke Sister   . Stroke Sister   . Cancer Maternal Grandfather        bone    Social History   Socioeconomic History  . Marital status: Married     Spouse name: Not on file  . Number of children: 2  . Years of education: Not on file  . Highest education level: Not on file  Occupational History  . Occupation: [employer]    Comment: Retired  Scientific laboratory technician  . Financial resource strain: Not on file  . Food insecurity    Worry: Not on file    Inability: Not on file  . Transportation needs    Medical: Not on file    Non-medical: Not on file  Tobacco Use  . Smoking status: Former Research scientist (life sciences)  . Smokeless tobacco: Never Used  Substance and Sexual Activity  . Alcohol use: No  . Drug use: No  . Sexual activity: Not on file  Lifestyle  . Physical activity    Days per week: Not on file    Minutes per session: Not on file  . Stress: Not on file  Relationships  . Social Herbalist on phone: Not on file    Gets together: Not on file    Attends religious service: Not on file    Active member of club or organization: Not on file    Attends meetings of clubs or organizations: Not on file    Relationship status: Not on file  . Intimate partner violence    Fear of current or ex partner: Not on file  Emotionally abused: Not on file    Physically abused: Not on file    Forced sexual activity: Not on file  Other Topics Concern  . Not on file  Social History Narrative   No living will--plans to do this soon   Husband to be health care POA-- alternate is daughters   Would accept resuscitation   Would accept feeding tube   Review of Systems No headaches Appetite is fine Weight down a few pounds Bowels are fine---no blood No heartburn or dysphagia Sleeps well Wears seat belt Teeth okay---overdue for dentist No suspicious skin lesions No sig back or joint pain    Objective:   Physical Exam  Constitutional: She is oriented to person, place, and time. She appears well-developed. No distress.  HENT:  Mouth/Throat: Oropharynx is clear and moist. No oropharyngeal exudate.  Neck: No thyromegaly present.   Cardiovascular: Normal rate, regular rhythm, normal heart sounds and intact distal pulses. Exam reveals no gallop.  No murmur heard. Respiratory: Effort normal and breath sounds normal. No respiratory distress. She has no wheezes. She has no rales.  GI: Soft. There is no abdominal tenderness.  Musculoskeletal:        General: No tenderness.     Comments: Trace ankle edema  Lymphadenopathy:    She has no cervical adenopathy.  Neurological: She is alert and oriented to person, place, and time.  President--- "Edmon Crape, Trump, Obama, Bush" [phone] D-l-o-r-w Recall 3/3  Skin: No rash noted. No erythema.  Psychiatric: She has a normal mood and affect. Her behavior is normal.           Assessment & Plan:

## 2019-04-30 ENCOUNTER — Other Ambulatory Visit: Payer: Self-pay | Admitting: Internal Medicine

## 2019-06-29 NOTE — Progress Notes (Signed)
Left voicemail to call back and schedule Colonoscopy and previsit appointment with Dr Carlean Purl.

## 2019-07-06 ENCOUNTER — Telehealth: Payer: Self-pay

## 2019-07-06 NOTE — Telephone Encounter (Signed)
[facility] - Client TELEPHONE ADVICE RECORD [facility] Patient Name: Kristine Hammond Gender: Female DOB: 05-31-1950 Age: 69 Y 6 M 27 D Return Phone Number: [phone] (Primary) Address: City/State/Zip: [place] [address] Client [facility] - Client Client Site [place] - Day Physician Viviana Simpler - MD Contact Type Call Who Is Calling Patient / Member / Family / Caregiver Call Type Triage / Clinical Relationship To Patient Self Return Phone Number [phone] (Primary) Chief Complaint Dizziness Reason for Call Symptomatic / Request for [name] states she has been having dizzy spells over the last few days and it is not improving. [facility] in [place] Translation No Nurse Assessment Nurse: Juleen China, RN, Butch Penny Date/Time Eilene Ghazi Time): 07/06/2019 9:39:16 AM Confirm and document reason for call. If symptomatic, describe symptoms. ---Caller states she has been having dizzy spells since Saturday and it is not improving. Weakness on left side. No fever, cough or SOB. Has the patient had close contact with a person known or suspected to have the novel coronavirus illness OR traveled / lives in area with major community spread (including international travel) in the last 14 days from the onset of symptoms? * If Asymptomatic, screen for exposure and travel within the last 14 days. ---No Does the patient have any new or worsening symptoms? ---Yes Will a triage be completed? ---Yes Related visit to physician within the last 2 weeks? ---No Does the PT have any chronic conditions? (i.e. diabetes, asthma, this includes High risk factors for pregnancy, etc.) ---Yes List chronic conditions. ---HTN Is this a behavioral health or substance abuse call? ---No Guidelines Guideline Title Affirmed Question Affirmed Notes Nurse Date/Time  (Eastern Time) Dizziness - Lightheadedness [1] Weakness (i.e., paralysis, loss of muscle strength) of the face, arm / hand, or leg / foot on one side of the body AND [2] Kristine Hammond 07/06/2019 9:41:07 AM PLEASE NOTE: All timestamps contained within this report are represented as [place] Standard Time. CONFIDENTIALTY NOTICE: This fax transmission is intended only for the addressee. It contains information that is legally privileged, confidential or otherwise protected from use or disclosure. If you are not the intended recipient, you are strictly prohibited from reviewing, disclosing, copying using or disseminating any of this information or taking any action in reliance on or regarding this information. If you have received this fax in error, please notify us immediately by telephone so that we can arrange for its return to [place]. Phone: [phone], Toll-Free: [phone], Fax: [phone] Page: 2 of 2 Call Id: [id number] Guidelines Guideline Title Affirmed Question Affirmed Notes Nurse Date/Time Eilene Ghazi Time) sudden onset AND [3] brief (now gone) Disp. Time Eilene Ghazi Time) Disposition Final User 07/06/2019 9:45:27 AM Go to ED Now (or PCP triage) Yes Juleen China, RN, [facility] Disagree/Comply Comply Caller Understands Yes PreDisposition Call Doctor Care Advice Given Per Guideline GO TO ED NOW (OR PCP TRIAGE): * IF NO PCP (PRIMARY CARE PROVIDER) SECOND-LEVEL TRIAGE: You need to be seen within the next hour. Go to the [facility] at _____________ [place] as soon as you can. ANOTHER ADULT SHOULD DRIVE: * It is better and safer if another adult drives instead of you. BRING MEDICINES: * Please bring a list of your current medicines when you go to the Emergency Department (ER). * It is also a good idea to bring the pill bottles too. This will help the doctor to make certain you are taking the  right medicines and the right dose. CARE ADVICE given per Dizziness (Adult)  guideline. Comments User: Jennye Moccasin, RN Date/Time Eilene Ghazi Time): 07/06/2019 9:50:42 AM After discussing further outcome was to go to ER. She lives 6 miles away. States the weakness is less since she woke up. Only in left leg. Referrals GO TO FACILITY OTHER - SPECIFY

## 2019-07-06 NOTE — Telephone Encounter (Signed)
I spoke with pt; pt said she did not go anywhere earlier this morning. Pt said she feels OK now. No H/A,dizziness, lt sided weakness is gone. Pt said BP 138/70 P 72. UC & ED precautions given and pt voiced understanding. FYI to Dr Silvio Pate and to Glenda Chroman FNP who is in office.

## 2019-07-06 NOTE — Telephone Encounter (Signed)
unable to reach pt by phone and left v/m for pt to cb. Also unable to reach pts husband by phone. I called [facility] ED but pt is not registered there. I called [facility] and pt is not there also. Tried to call pt again but no answer. FYI to [facility] LPN and [facility] CMA.

## 2019-07-06 NOTE — Telephone Encounter (Signed)
Noted  

## 2019-07-07 NOTE — Telephone Encounter (Signed)
Left message to call office to let us know how she was feeling.

## 2019-07-07 NOTE — Telephone Encounter (Signed)
Please check on her again today.

## 2019-07-13 NOTE — Telephone Encounter (Addendum)
Spoke to pt. She said she is feeling better. Thinks it was BP related. Made appt 07-24-19 to discuss BP.

## 2019-07-24 ENCOUNTER — Ambulatory Visit (INDEPENDENT_AMBULATORY_CARE_PROVIDER_SITE_OTHER): Payer: Medicare Other | Admitting: Internal Medicine

## 2019-07-24 ENCOUNTER — Encounter: Payer: Self-pay | Admitting: Internal Medicine

## 2019-07-24 ENCOUNTER — Ambulatory Visit
Admission: RE | Admit: 2019-07-24 | Discharge: 2019-07-24 | Disposition: A | Discharge status: Home / Self Care | Payer: Medicare Other | Source: Ambulatory Visit | Attending: Internal Medicine | Admitting: Internal Medicine

## 2019-07-24 ENCOUNTER — Other Ambulatory Visit: Payer: Self-pay

## 2019-07-24 VITALS — BP 182/104 | HR 66 | Temp 98.1°F | Ht 65.0 in | Wt 206.0 lb

## 2019-07-24 DIAGNOSIS — Z8673 Personal history of transient ischemic attack (TIA), and cerebral infarction without residual deficits: Secondary | ICD-10-CM | POA: Insufficient documentation

## 2019-07-24 DIAGNOSIS — I10 Essential (primary) hypertension: Secondary | ICD-10-CM

## 2019-07-24 DIAGNOSIS — G459 Transient cerebral ischemic attack, unspecified: Secondary | ICD-10-CM

## 2019-07-24 DIAGNOSIS — R4781 Slurred speech: Secondary | ICD-10-CM | POA: Diagnosis not present

## 2019-07-24 MED ORDER — LOSARTAN POTASSIUM-HCTZ 100-25 MG PO TABS: 1.0000 | ORAL_TABLET | Freq: Every day | ORAL | 3 refills | Status: DC

## 2019-07-24 NOTE — Progress Notes (Signed)
Subjective:    Patient ID: Kristine Hammond, female    DOB: [birth year], 69 y.o.   MRN: [id number]  HPI Here due to concerns about blood pressure and neurologic symptoms This visit occurred during the SARS-CoV-2 public health emergency.  Safety protocols were in place, including screening questions prior to the visit, additional usage of staff PPE, and extensive cleaning of exam room while observing appropriate contact time as indicated for disinfecting solutions.   Had spell 3/8 Left leg was weak--able to walk but with difficulty Speech was slurred Was worried about a stroke Was able to drive and go out---symptoms resolved after about 7-8 hours later No recurrent symptoms BP only 138/70  Current Outpatient Medications on File Prior to Visit  Medication Sig Dispense Refill  . amLODipine (NORVASC) 10 MG tablet TAKE 1 TABLET (10 MG TOTAL) BY MOUTH DAILY. NEEDS OFFICE OR VIRTUAL VISIT 90 tablet 3   No current facility-administered medications on file prior to visit.    No Known Allergies  Past Medical History:  Diagnosis Date  . Grave's disease    treated with RAI  . Hyperlipidemia   . Hypertension   . Vaginal delivery     x 2    Past Surgical History:  Procedure Laterality Date  . BREAST BIOPSY Right   . BREAST SURGERY Right 2014   rt breast biopsy  . gyn surgery     hysterectomy form fibroids    Family History  Problem Relation Age of Onset  . Stroke Mother   . Stroke Father   . Stroke Brother   . Stroke Sister   . Stroke Sister   . Cancer Maternal Grandfather        bone    Social History   Socioeconomic History  . Marital status: Married    Spouse name: Not on file  . Number of children: 2  . Years of education: Not on file  . Highest education level: Not on file  Occupational History  . Occupation: [employer]    Comment: Retired  Tobacco Use  . Smoking status: Former Research scientist (life sciences)  . Smokeless tobacco: Never Used  Substance and Sexual  Activity  . Alcohol use: No  . Drug use: No  . Sexual activity: Not on file  Other Topics Concern  . Not on file  Social History Narrative   No living will--plans to do this soon   Husband to be health care POA-- alternate is daughters   Would accept resuscitation   Would accept feeding tube   Social Determinants of Health   Financial Resource Strain:   . Difficulty of Paying Living Expenses:   Food Insecurity:   . Worried About Charity fundraiser in the Last Year:   . Arboriculturist in the Last Year:   Transportation Needs:   . Film/video editor (Medical):   Marland Kitchen Lack of Transportation (Non-Medical):   Physical Activity:   . Days of Exercise per Week:   . Minutes of Exercise per Session:   Stress:   . Feeling of Stress :   Social Connections:   . Frequency of Communication with Friends and Family:   . Frequency of Social Gatherings with Friends and Family:   . Attends Religious Services:   . Active Member of Clubs or Organizations:   . Attends Archivist Meetings:   Marland Kitchen Marital Status:   Intimate Partner Violence:   . Fear of Current or Ex-Partner:   . Emotionally Abused:   .  Physically Abused:   . Sexually Abused:    Review of Systems No headaches No chest pain No SOB Eating well No sleep problems Still watches grandchildren    Objective:   Physical Exam  Constitutional: She is oriented to person, place, and time. She appears well-developed. No distress.  Neck: No thyromegaly present.  No carotid bruits  Cardiovascular: Normal rate, regular rhythm and normal heart sounds. Exam reveals no gallop.  No murmur heard. Respiratory: Effort normal and breath sounds normal. No respiratory distress. She has no wheezes. She has no rales.  GI: Soft. There is no abdominal tenderness.  Musculoskeletal:        General: No edema.  Lymphadenopathy:    She has no cervical adenopathy.  Neurological: She is oriented to person, place, and time. She has normal  strength. No cranial nerve deficit. She exhibits normal muscle tone. She displays a negative Romberg sign. Coordination and gait normal.           Assessment & Plan:

## 2019-07-24 NOTE — Patient Instructions (Signed)
Start a coated aspirin 81mg  daily. If you have any more symptoms like the weakness or change in speech, call 911 for emergency evaluation.

## 2019-07-24 NOTE — Assessment & Plan Note (Signed)
BP Readings from Last 3 Encounters:  07/24/19 (!) 182/104  04/10/19 138/86  09/12/17 140/86   Actually higher on right when I rechecked She had been on 3 meds in past Will restart losartan/HCTZ Recheck 2 weeks

## 2019-07-24 NOTE — Assessment & Plan Note (Signed)
Transient neuro symptoms suggestive of right carotid lesion 11 days ago No neuro signs now BP very high Will check carotids and MRI brain Start ASA Consider statin--if abnormal imaging

## 2019-07-27 ENCOUNTER — Ambulatory Visit (HOSPITAL_COMMUNITY)
Admission: RE | Admit: 2019-07-27 | Payer: Medicare Other | Source: Ambulatory Visit | Attending: Internal Medicine | Admitting: Internal Medicine

## 2019-08-07 ENCOUNTER — Ambulatory Visit (HOSPITAL_COMMUNITY)
Admission: RE | Admit: 2019-08-07 | Discharge: 2019-08-07 | Disposition: A | Discharge status: Home / Self Care | Payer: Medicare Other | Source: Ambulatory Visit | Attending: Cardiology | Admitting: Cardiology

## 2019-08-07 ENCOUNTER — Other Ambulatory Visit: Payer: Self-pay

## 2019-08-07 DIAGNOSIS — G459 Transient cerebral ischemic attack, unspecified: Secondary | ICD-10-CM | POA: Diagnosis not present

## 2019-08-14 ENCOUNTER — Ambulatory Visit (INDEPENDENT_AMBULATORY_CARE_PROVIDER_SITE_OTHER): Payer: Medicare Other | Admitting: Internal Medicine

## 2019-08-14 ENCOUNTER — Encounter: Payer: Self-pay | Admitting: Internal Medicine

## 2019-08-14 ENCOUNTER — Other Ambulatory Visit: Payer: Self-pay

## 2019-08-14 DIAGNOSIS — G459 Transient cerebral ischemic attack, unspecified: Secondary | ICD-10-CM

## 2019-08-14 MED ORDER — ATORVASTATIN CALCIUM 20 MG PO TABS: 20.0000 mg | ORAL_TABLET | Freq: Every day | ORAL | 3 refills | Status: DC

## 2019-08-14 NOTE — Assessment & Plan Note (Signed)
No recurrence Now on daily ASA BP is fine Will add statin---atorvastatin

## 2019-08-14 NOTE — Progress Notes (Signed)
Subjective:    Patient ID: Kristine Hammond, female    DOB: [birth year], 69 y.o.   MRN: [id number]  HPI Here for follow up of TIA This visit occurred during the SARS-CoV-2 public health emergency.  Safety protocols were in place, including screening questions prior to the visit, additional usage of staff PPE, and extensive cleaning of exam room while observing appropriate contact time as indicated for disinfecting solutions.   Reviewed MRI--- no CVA, just mild microvascular changes Carotids okay  No new neurologic symptoms No headaches  Current Outpatient Medications on File Prior to Visit  Medication Sig Dispense Refill  . amLODipine (NORVASC) 10 MG tablet TAKE 1 TABLET (10 MG TOTAL) BY MOUTH DAILY. NEEDS OFFICE OR VIRTUAL VISIT 90 tablet 3  . aspirin EC 81 MG tablet Take 81 mg by mouth daily.    Marland Kitchen losartan-hydrochlorothiazide (HYZAAR) 100-25 MG tablet Take 1 tablet by mouth daily. 90 tablet 3   No current facility-administered medications on file prior to visit.    No Known Allergies  Past Medical History:  Diagnosis Date  . Grave's disease    treated with RAI  . Hyperlipidemia   . Hypertension   . Vaginal delivery     x 2    Past Surgical History:  Procedure Laterality Date  . BREAST BIOPSY Right   . BREAST SURGERY Right 2014   rt breast biopsy  . gyn surgery     hysterectomy form fibroids    Family History  Problem Relation Age of Onset  . Stroke Mother   . Stroke Father   . Stroke Brother   . Stroke Sister   . Stroke Sister   . Cancer Maternal Grandfather        bone    Social History   Socioeconomic History  . Marital status: Married    Spouse name: Not on file  . Number of children: 2  . Years of education: Not on file  . Highest education level: Not on file  Occupational History  . Occupation: [employer]    Comment: Retired  Tobacco Use  . Smoking status: Former Research scientist (life sciences)  . Smokeless tobacco: Never Used  Substance and Sexual  Activity  . Alcohol use: No  . Drug use: No  . Sexual activity: Not on file  Other Topics Concern  . Not on file  Social History Narrative   No living will--plans to do this soon   Husband to be health care POA-- alternate is daughters   Would accept resuscitation   Would accept feeding tube   Social Determinants of Health   Financial Resource Strain:   . Difficulty of Paying Living Expenses:   Food Insecurity:   . Worried About Charity fundraiser in the Last Year:   . Arboriculturist in the Last Year:   Transportation Needs:   . Film/video editor (Medical):   Marland Kitchen Lack of Transportation (Non-Medical):   Physical Activity:   . Days of Exercise per Week:   . Minutes of Exercise per Session:   Stress:   . Feeling of Stress :   Social Connections:   . Frequency of Communication with Friends and Family:   . Frequency of Social Gatherings with Friends and Family:   . Attends Religious Services:   . Active Member of Clubs or Organizations:   . Attends Archivist Meetings:   Marland Kitchen Marital Status:   Intimate Partner Violence:   . Fear of Current or Ex-Partner:   .  Emotionally Abused:   Marland Kitchen Physically Abused:   . Sexually Abused:    Review of Systems No chest pain No SOB    Objective:   Physical Exam  Constitutional: She appears well-developed. No distress.  Psychiatric: She has a normal mood and affect. Her behavior is normal.           Assessment & Plan:

## 2019-09-07 ENCOUNTER — Encounter: Payer: Self-pay | Admitting: Internal Medicine

## 2019-10-20 ENCOUNTER — Telehealth: Payer: Self-pay | Admitting: *Deleted

## 2019-10-20 NOTE — Telephone Encounter (Signed)
Called pt again and was able to reach her. Pt r/s PV for Monday 6/21 at 9:00 am.

## 2019-10-20 NOTE — Telephone Encounter (Signed)
LM on VM for patient to call back to r/s PV for colonoscopy.  Advised pt that procedure will be cancelled if she does not call back to reschedule.

## 2019-10-26 ENCOUNTER — Ambulatory Visit (AMBULATORY_SURGERY_CENTER): Payer: Self-pay | Admitting: *Deleted

## 2019-10-26 ENCOUNTER — Other Ambulatory Visit: Payer: Self-pay

## 2019-10-26 VITALS — Ht 66.0 in | Wt 203.0 lb

## 2019-10-26 DIAGNOSIS — Z1211 Encounter for screening for malignant neoplasm of colon: Secondary | ICD-10-CM

## 2019-10-26 NOTE — Progress Notes (Signed)

## 2019-10-27 ENCOUNTER — Encounter: Payer: Self-pay | Admitting: Internal Medicine

## 2019-11-03 ENCOUNTER — Other Ambulatory Visit: Payer: Self-pay

## 2019-11-03 ENCOUNTER — Ambulatory Visit (AMBULATORY_SURGERY_CENTER): Payer: Medicare Other | Admitting: Internal Medicine

## 2019-11-03 ENCOUNTER — Encounter: Payer: Self-pay | Admitting: Internal Medicine

## 2019-11-03 VITALS — BP 168/78 | HR 59 | Temp 97.8°F | Resp 13 | Ht 66.0 in | Wt 203.0 lb

## 2019-11-03 DIAGNOSIS — Z1211 Encounter for screening for malignant neoplasm of colon: Secondary | ICD-10-CM | POA: Diagnosis not present

## 2019-11-03 DIAGNOSIS — K621 Rectal polyp: Secondary | ICD-10-CM

## 2019-11-03 DIAGNOSIS — D128 Benign neoplasm of rectum: Secondary | ICD-10-CM

## 2019-11-03 MED ORDER — SODIUM CHLORIDE 0.9 % IV SOLN: 500.0000 mL | Freq: Once | INTRAVENOUS | Status: DC

## 2019-11-03 NOTE — Progress Notes (Signed)
Pt's states no medical or surgical changes since previsit or office visit.  VS, CW

## 2019-11-03 NOTE — Progress Notes (Signed)
To PACU, VSS. Report to [email or web address].tb 

## 2019-11-03 NOTE — Op Note (Signed)
[place] Patient Name: Kristine Hammond Procedure Date: 11/03/2019 9:26 AM MRN: [id number] Endoscopist: Gatha Mayer , MD Age: 69 Referring MD:  Date of Birth: 23-May-1950 Gender: Female Account #: [id number] Procedure:                Colonoscopy Indications:              Screening for colorectal malignant neoplasm, Last                            colonoscopy: 2010 Medicines:                Propofol per Anesthesia, Monitored Anesthesia Care Procedure:                Pre-Anesthesia Assessment:                           - Prior to the procedure, a History and Physical                            was performed, and patient medications and                            allergies were reviewed. The patient's tolerance of                            previous anesthesia was also reviewed. The risks                            and benefits of the procedure and the sedation                            options and risks were discussed with the patient.                            All questions were answered, and informed consent                            was obtained. Prior Anticoagulants: The patient has                            taken no previous anticoagulant or antiplatelet                            agents. ASA Grade Assessment: III - A patient with                            severe systemic disease. After reviewing the risks                            and benefits, the patient was deemed in                            satisfactory condition to undergo the procedure.  After obtaining informed consent, the colonoscope                            was passed under direct vision. Throughout the                            procedure, the patient's blood pressure, pulse, and                            oxygen saturations were monitored continuously. The                            Colonoscope was introduced through the anus and                            advanced to the  the cecum, identified by                            appendiceal orifice and ileocecal valve. The                            colonoscopy was performed without difficulty. The                            patient tolerated the procedure well. The quality                            of the bowel preparation was good. The ileocecal                            valve, appendiceal orifice, and rectum were                            photographed. The bowel preparation used was                            Miralax via split dose instruction. Scope In: 9:34:33 AM Scope Out: 9:48:31 AM Scope Withdrawal Time: 0 hours 11 minutes 25 seconds  Total Procedure Duration: 0 hours 13 minutes 58 seconds  Findings:                 The perianal and digital rectal examinations were                            normal.                           A diminutive polyp was found in the rectum. The                            polyp was sessile. The polyp was removed with a                            cold snare. Resection and retrieval were complete.  Verification of patient identification for the                            specimen was done. Estimated blood loss was minimal.                           Diverticula were found in the sigmoid colon.                           The exam was otherwise without abnormality on                            direct and retroflexion views. Complications:            No immediate complications. Estimated Blood Loss:     Estimated blood loss was minimal. Impression:               - One diminutive polyp in the rectum, removed with                            a cold snare. Resected and retrieved.                           - Diverticulosis in the sigmoid colon.                           - The examination was otherwise normal on direct                            and retroflexion views. Recommendation:           - Patient has a contact number available for                             emergencies. The signs and symptoms of potential                            delayed complications were discussed with the                            patient. Return to normal activities tomorrow.                            Written discharge instructions were provided to the                            patient.                           - Resume previous diet.                           - Continue present medications.                           - Repeat colonoscopy is recommended. The  colonoscopy date will be determined after pathology                            results from today's exam become available for                            review. Gatha Mayer, MD 11/03/2019 9:55:05 AM This report has been signed electronically.

## 2019-11-03 NOTE — Progress Notes (Signed)
Called to room to assist during endoscopic procedure.  Patient ID and intended procedure confirmed with present staff. Received instructions for my participation in the procedure from the performing physician.  

## 2019-11-03 NOTE — Patient Instructions (Addendum)
I found and removed one very tiny polyp.  I will let you know pathology results and when to have another routine colonoscopy by mail and/or My Chart.   You also have a condition called diverticulosis - common and not usually a problem. Please read the handout provided.  I appreciate the opportunity to care for you.  Gatha Mayer, MD, [facility]   Handouts given for diverticulosis and polyps.  YOU HAD AN ENDOSCOPIC PROCEDURE TODAY AT [facility]:   Refer to the procedure report that was given to you for any specific questions about what was found during the examination.  If the procedure report does not answer your questions, please call your gastroenterologist to clarify.  If you requested that your care partner not be given the details of your procedure findings, then the procedure report has been included in a sealed envelope for you to review at your convenience later.  YOU SHOULD EXPECT: Some feelings of bloating in the abdomen. Passage of more gas than usual.  Walking can help get rid of the air that was put into your GI tract during the procedure and reduce the bloating. If you had a lower endoscopy (such as a colonoscopy or flexible sigmoidoscopy) you may notice spotting of blood in your stool or on the toilet paper. If you underwent a bowel prep for your procedure, you may not have a normal bowel movement for a few days.  Please Note:  You might notice some irritation and congestion in your nose or some drainage.  This is from the oxygen used during your procedure.  There is no need for concern and it should clear up in a day or so.  SYMPTOMS TO REPORT IMMEDIATELY:   Following lower endoscopy (colonoscopy or flexible sigmoidoscopy):  Excessive amounts of blood in the stool  Significant tenderness or worsening of abdominal pains  Swelling of the abdomen that is new, acute  Fever of 100F or higher  For urgent or emergent issues, a gastroenterologist can be reached at  any hour by calling [phone]. Do not use MyChart messaging for urgent concerns.    DIET:  We do recommend a small meal at first, but then you may proceed to your regular diet.  Drink plenty of fluids but you should avoid alcoholic beverages for 24 hours.  ACTIVITY:  You should plan to take it easy for the rest of today and you should NOT DRIVE or use heavy machinery until tomorrow (because of the sedation medicines used during the test).    FOLLOW UP: Our staff will call the number listed on your records 48-72 hours following your procedure to check on you and address any questions or concerns that you may have regarding the information given to you following your procedure. If we do not reach you, we will leave a message.  We will attempt to reach you two times.  During this call, we will ask if you have developed any symptoms of COVID 19. If you develop any symptoms (ie: fever, flu-like symptoms, shortness of breath, cough etc.) before then, please call [phone].  If you test positive for Covid 19 in the 2 weeks post procedure, please call and report this information to [place].    If any biopsies were taken you will be contacted by phone or by letter within the next 1-3 weeks.  Please call us at [phone] if you have not heard about the biopsies in 3 weeks.    SIGNATURES/CONFIDENTIALITY: You and/or  your care partner have signed paperwork which will be entered into your electronic medical record.  These signatures attest to the fact that that the information above on your After Visit Summary has been reviewed and is understood.  Full responsibility of the confidentiality of this discharge information lies with you and/or your care-partner.

## 2019-11-05 ENCOUNTER — Telehealth: Payer: Self-pay

## 2019-11-05 NOTE — Telephone Encounter (Signed)
  Follow up Call-  Call back number 11/03/2019  Post procedure Call Back phone  # [phone]  Permission to leave phone message Yes  Some recent data might be hidden     Patient questions:  Do you have a fever, pain , or abdominal swelling? No. Pain Score  0 *  Have you tolerated food without any problems? Yes.    Have you been able to return to your normal activities? Yes.    Do you have any questions about your discharge instructions: Diet   No. Medications  No. Follow up visit  No.  Do you have questions or concerns about your Care? No.  Actions: * If pain score is 4 or above: No action needed, pain <4.  1. Have you developed a fever since your procedure? no  2.   Have you had an respiratory symptoms (SOB or cough) since your procedure? no  3.   Have you tested positive for COVID 19 since your procedure no  4.   Have you had any family members/close contacts diagnosed with the COVID 19 since your procedure?  no   If yes to any of these questions please route to Joylene John, RN and Erenest Rasher, RN

## 2019-11-10 ENCOUNTER — Encounter: Payer: Self-pay | Admitting: Internal Medicine

## 2020-04-13 ENCOUNTER — Encounter: Payer: Medicare Other | Admitting: Internal Medicine

## 2020-04-13 DIAGNOSIS — Z0289 Encounter for other administrative examinations: Secondary | ICD-10-CM

## 2020-07-13 ENCOUNTER — Other Ambulatory Visit: Payer: Self-pay | Admitting: Internal Medicine

## 2020-08-07 ENCOUNTER — Other Ambulatory Visit: Payer: Self-pay | Admitting: Internal Medicine

## 2020-08-30 ENCOUNTER — Ambulatory Visit (INDEPENDENT_AMBULATORY_CARE_PROVIDER_SITE_OTHER): Payer: Medicare Other | Admitting: Internal Medicine

## 2020-08-30 ENCOUNTER — Encounter: Payer: Self-pay | Admitting: Internal Medicine

## 2020-08-30 ENCOUNTER — Other Ambulatory Visit: Payer: Self-pay

## 2020-08-30 VITALS — BP 152/70 | HR 67 | Temp 98.0°F | Ht 65.5 in | Wt 209.0 lb

## 2020-08-30 DIAGNOSIS — G459 Transient cerebral ischemic attack, unspecified: Secondary | ICD-10-CM

## 2020-08-30 DIAGNOSIS — R609 Edema, unspecified: Secondary | ICD-10-CM | POA: Diagnosis not present

## 2020-08-30 DIAGNOSIS — Z0000 Encounter for general adult medical examination without abnormal findings: Secondary | ICD-10-CM | POA: Diagnosis not present

## 2020-08-30 DIAGNOSIS — Z7189 Other specified counseling: Secondary | ICD-10-CM | POA: Diagnosis not present

## 2020-08-30 DIAGNOSIS — I10 Essential (primary) hypertension: Secondary | ICD-10-CM | POA: Diagnosis not present

## 2020-08-30 DIAGNOSIS — E0500 Thyrotoxicosis with diffuse goiter without thyrotoxic crisis or storm: Secondary | ICD-10-CM | POA: Diagnosis not present

## 2020-08-30 DIAGNOSIS — E2839 Other primary ovarian failure: Secondary | ICD-10-CM

## 2020-08-30 MED ORDER — ROSUVASTATIN CALCIUM 10 MG PO TABS: 10.0000 mg | ORAL_TABLET | ORAL | 3 refills | Status: DC

## 2020-08-30 MED ORDER — AMLODIPINE BESYLATE 10 MG PO TABS: 10.0000 mg | ORAL_TABLET | Freq: Every day | ORAL | 3 refills | Status: DC

## 2020-08-30 MED ORDER — SPIRONOLACTONE 25 MG PO TABS: 25.0000 mg | ORAL_TABLET | Freq: Every day | ORAL | 3 refills | Status: DC

## 2020-08-30 MED ORDER — LOSARTAN POTASSIUM-HCTZ 100-25 MG PO TABS: 1.0000 | ORAL_TABLET | Freq: Every day | ORAL | 3 refills | Status: DC

## 2020-08-30 NOTE — Assessment & Plan Note (Signed)
MRI and carotids okay Did happen after COVID vaccine Asked her to resume ASA every other day Trial of rosuvastatin twice a week

## 2020-08-30 NOTE — Assessment & Plan Note (Addendum)
Reasonably healthy Discussed fitness Will check DEXA --never done Done with colons--normal 2021 Needs mammogram Had COVID vaccine--then TIA. Will hold off for now Flu vaccine in the fall Td if any injury

## 2020-08-30 NOTE — Patient Instructions (Signed)
Please set up your screening mammogram---and ask for the bone density at the same time.

## 2020-08-30 NOTE — Assessment & Plan Note (Signed)
discussed support hose

## 2020-08-30 NOTE — Progress Notes (Signed)
Subjective:    Patient ID: Kristine Hammond, female    DOB: Dec 25, 1950, 70 y.o.   MRN: [id number]  HPI Here for Medicare wellness visit and follow up of chronic health conditions This visit occurred during the SARS-CoV-2 public health emergency.  Safety protocols were in place, including screening questions prior to the visit, additional usage of staff PPE, and extensive cleaning of exam room while observing appropriate contact time as indicated for disinfecting solutions.   Reviewed form and advanced directives Reviewed other doctors No alcohol or tobacco Not exercising--discussed Vision is okay---reading glasses Hearing is good No falls No depression or anhedonia Independent with instrumental ADLs No sig memory issues  Ran out of amlodipine a while back [facility] didn't approve the refill No chest pain or SOB No dizziness or syncope No recent edema---had in past  No recurrence of neurologic symptoms Stopped the aspirin with the recent publicity Stopped the atorvastatin after a short time--due to severe myalgias  Energy levels are okay Weight is stable--still high No change in hair, skin or nails Had RAI for [place] long ago  Current Outpatient Medications on File Prior to Visit  Medication Sig Dispense Refill  . amLODipine (NORVASC) 10 MG tablet TAKE 1 TABLET (10 MG TOTAL) BY MOUTH DAILY. NEEDS OFFICE OR VIRTUAL VISIT 90 tablet 3  . aspirin EC 81 MG tablet Take 81 mg by mouth daily.    . Aspirin-Salicylamide-Caffeine (BC HEADACHE POWDER PO) Take by mouth as needed.    Marland Kitchen losartan-hydrochlorothiazide (HYZAAR) 100-25 MG tablet Take 1 tablet by mouth daily. 90 tablet 3  . Multiple Vitamin (MULTIVITAMIN) tablet Take 1 tablet by mouth daily.     No current facility-administered medications on file prior to visit.    Allergies  Allergen Reactions  . Atorvastatin Other (See Comments)    Past Medical History:  Diagnosis Date  . Grave's disease    treated with RAI   . Hyperlipidemia   . Hypertension   . Vaginal delivery     x 2    Past Surgical History:  Procedure Laterality Date  . BREAST BIOPSY Right   . BREAST SURGERY Right 2014   rt breast biopsy  . gyn surgery     hysterectomy form fibroids    Family History  Problem Relation Age of Onset  . Stroke Mother   . Stroke Father   . Stroke Brother   . Stroke Sister   . Stroke Sister   . Cancer Maternal Grandfather        bone  . Colon cancer Neg Hx   . Colon polyps Neg Hx   . Stomach cancer Neg Hx   . Esophageal cancer Neg Hx     Social History   Socioeconomic History  . Marital status: Married    Spouse name: Not on file  . Number of children: 2  . Years of education: Not on file  . Highest education level: Not on file  Occupational History  . Occupation: [employer]    Comment: Retired  Tobacco Use  . Smoking status: Former Research scientist (life sciences)  . Smokeless tobacco: Never Used  Vaping Use  . Vaping Use: Never used  Substance and Sexual Activity  . Alcohol use: No  . Drug use: No  . Sexual activity: Not on file  Other Topics Concern  . Not on file  Social History Narrative   No living will--plans to do this soon   Husband to be health care POA-- alternate is  daughters   Would accept resuscitation   Would accept feeding tube   Social Determinants of Health   Financial Resource Strain: Not on file  Food Insecurity: Not on file  Transportation Needs: Not on file  Physical Activity: Not on file  Stress: Not on file  Social Connections: Not on file  Intimate Partner Violence: Not on file   Review of Systems Appetite is okay Sleeps well Wears seat belt Teeth okay---hasn't seen dentist in a while No suspicious skin lesions No heartburn or dysphagia Voids okay---wears pads at night---mixed leakage (cough or urge) Bowels move fine--no blood No sig back or joint pains    Objective:   Physical Exam Constitutional:      Appearance: Normal appearance.  HENT:      Mouth/Throat:     Comments: No lesions Eyes:     Conjunctiva/sclera: Conjunctivae normal.     Pupils: Pupils are equal, round, and reactive to light.  Cardiovascular:     Rate and Rhythm: Normal rate and regular rhythm.     Pulses: Normal pulses.     Heart sounds: No murmur heard. No gallop.   Pulmonary:     Effort: Pulmonary effort is normal.     Breath sounds: Normal breath sounds. No wheezing or rales.  Abdominal:     Palpations: Abdomen is soft.     Tenderness: There is no abdominal tenderness.  Musculoskeletal:     Cervical back: Neck supple.     Comments: Puffiness in feet without pitting  Lymphadenopathy:     Cervical: No cervical adenopathy.  Skin:    General: Skin is warm.     Findings: No rash.  Neurological:     Mental Status: She is alert and oriented to person, place, and time.     Comments: President--- "Waymond Cera, Obama" [id number] Recall 3/3  Psychiatric:        Mood and Affect: Mood normal.        Behavior: Behavior normal.            Assessment & Plan:

## 2020-08-30 NOTE — Assessment & Plan Note (Signed)
BP Readings from Last 3 Encounters:  08/30/20 (!) 152/70  11/03/19 (!) 168/78  08/14/19 122/88   Repeat here 190/94 Seems to have resistant pattern Will restart amlodipine Add spironolactone 25mg --recheck 1 month

## 2020-08-30 NOTE — Assessment & Plan Note (Signed)
PTU then RAI No symptomatic hypothyroidism Will check with labs in 1 month

## 2020-08-30 NOTE — Assessment & Plan Note (Signed)
See social history 

## 2020-08-30 NOTE — Progress Notes (Signed)
Hearing Screening   Method: Audiometry   125Hz 250Hz 500Hz 1000Hz 2000Hz 3000Hz 4000Hz 6000Hz 8000Hz  Right ear:   20 20 20  20    Left ear:   20 20 20  20      Visual Acuity Screening   Right eye Left eye Both eyes  Without correction: 20/40 20/40 20/25  With correction:       

## 2020-09-30 ENCOUNTER — Encounter: Payer: Self-pay | Admitting: Internal Medicine

## 2020-09-30 ENCOUNTER — Other Ambulatory Visit: Payer: Self-pay

## 2020-09-30 ENCOUNTER — Ambulatory Visit (INDEPENDENT_AMBULATORY_CARE_PROVIDER_SITE_OTHER): Payer: Medicare Other | Admitting: Internal Medicine

## 2020-09-30 VITALS — BP 140/86 | HR 70 | Temp 97.2°F | Ht 66.0 in | Wt 213.0 lb

## 2020-09-30 DIAGNOSIS — E0500 Thyrotoxicosis with diffuse goiter without thyrotoxic crisis or storm: Secondary | ICD-10-CM

## 2020-09-30 DIAGNOSIS — G459 Transient cerebral ischemic attack, unspecified: Secondary | ICD-10-CM | POA: Diagnosis not present

## 2020-09-30 DIAGNOSIS — I10 Essential (primary) hypertension: Secondary | ICD-10-CM

## 2020-09-30 LAB — LIPID PANEL
Cholesterol: 143 mg/dL (ref 0–200)
HDL: 49.8 mg/dL (ref >39.00)
LDL Cholesterol: 83 mg/dL (ref 0–99)
NonHDL: 92.7
Total CHOL/HDL Ratio: 3
Triglycerides: 51 mg/dL (ref 0.0–149.0)
VLDL: 10.2 mg/dL (ref 0.0–40.0)

## 2020-09-30 LAB — CBC
HCT: 38.4 % (ref 36.0–46.0)
Hemoglobin: 12.4 g/dL (ref 12.0–15.0)
MCHC: 32.3 g/dL (ref 30.0–36.0)
MCV: 81.4 fl (ref 78.0–100.0)
Platelets: 207 10*3/uL (ref 150.0–400.0)
RBC: 4.72 Mil/uL (ref 3.87–5.11)
RDW: 16.2 % — ABNORMAL HIGH (ref 11.5–15.5)
WBC: 5.7 10*3/uL (ref 4.0–10.5)

## 2020-09-30 LAB — RENAL FUNCTION PANEL
Albumin: 4.2 g/dL (ref 3.5–5.2)
BUN: 14 mg/dL (ref 6–23)
CO2: 31 mEq/L (ref 19–32)
Calcium: 9.3 mg/dL (ref 8.4–10.5)
Chloride: 104 mEq/L (ref 96–112)
Creatinine, Ser: 0.71 mg/dL (ref 0.40–1.20)
GFR: 86.62 mL/min (ref >60.00)
Glucose, Bld: 95 mg/dL (ref 70–99)
Phosphorus: 3.6 mg/dL (ref 2.3–4.6)
Potassium: 4.2 mEq/L (ref 3.5–5.1)
Sodium: 142 mEq/L (ref 135–145)

## 2020-09-30 LAB — HEPATIC FUNCTION PANEL
ALT: 15 U/L (ref 0–35)
AST: 14 U/L (ref 0–37)
Albumin: 4.2 g/dL (ref 3.5–5.2)
Alkaline Phosphatase: 43 U/L (ref 39–117)
Bilirubin, Direct: 0.1 mg/dL (ref 0.0–0.3)
Total Bilirubin: 0.3 mg/dL (ref 0.2–1.2)
Total Protein: 7.4 g/dL (ref 6.0–8.3)

## 2020-09-30 LAB — TSH: TSH: 1.15 u[IU]/mL (ref 0.35–4.50)

## 2020-09-30 LAB — T4, FREE: Free T4: 0.7 ng/dL (ref 0.60–1.60)

## 2020-09-30 NOTE — Assessment & Plan Note (Signed)
Past RAI Check labs

## 2020-09-30 NOTE — Assessment & Plan Note (Signed)
No persistent symptoms Will stop ASA--she takes a BC every morning

## 2020-09-30 NOTE — Progress Notes (Signed)
Subjective:    Patient ID: Kristine Hammond, female    DOB: Sep 28, 1950, 70 y.o.   MRN: [id number]  HPI Here for blood pressure follow up This visit occurred during the SARS-CoV-2 public health emergency.  Safety protocols were in place, including screening questions prior to the visit, additional usage of staff PPE, and extensive cleaning of exam room while observing appropriate contact time as indicated for disinfecting solutions.   Back on the amlodipine Started spironolactone No side effects No chest pain or SOB No dizziness or syncope  Current Outpatient Medications on File Prior to Visit  Medication Sig Dispense Refill  . amLODipine (NORVASC) 10 MG tablet Take 1 tablet (10 mg total) by mouth daily. 90 tablet 3  . aspirin EC 81 MG tablet Take 81 mg by mouth every other day.    . Aspirin-Salicylamide-Caffeine (BC HEADACHE POWDER PO) Take by mouth as needed.    Marland Kitchen losartan-hydrochlorothiazide (HYZAAR) 100-25 MG tablet Take 1 tablet by mouth daily. 90 tablet 3  . Multiple Vitamin (MULTIVITAMIN) tablet Take 1 tablet by mouth daily.    . rosuvastatin (CRESTOR) 10 MG tablet Take 1 tablet (10 mg total) by mouth 2 (two) times a week. 26 tablet 3  . spironolactone (ALDACTONE) 25 MG tablet Take 1 tablet (25 mg total) by mouth daily. 90 tablet 3   No current facility-administered medications on file prior to visit.    Allergies  Allergen Reactions  . Atorvastatin Other (See Comments)    Past Medical History:  Diagnosis Date  . Grave's disease    treated with RAI  . Hyperlipidemia   . Hypertension   . Vaginal delivery     x 2    Past Surgical History:  Procedure Laterality Date  . BREAST BIOPSY Right   . BREAST SURGERY Right 2014   rt breast biopsy  . gyn surgery     hysterectomy form fibroids    Family History  Problem Relation Age of Onset  . Stroke Mother   . Stroke Father   . Stroke Brother   . Stroke Sister   . Stroke Sister   . Cancer Maternal Grandfather         bone  . Colon cancer Neg Hx   . Colon polyps Neg Hx   . Stomach cancer Neg Hx   . Esophageal cancer Neg Hx     Social History   Socioeconomic History  . Marital status: Married    Spouse name: Not on file  . Number of children: 2  . Years of education: Not on file  . Highest education level: Not on file  Occupational History  . Occupation: [employer]    Comment: Retired  Tobacco Use  . Smoking status: Former Research scientist (life sciences)  . Smokeless tobacco: Never Used  Vaping Use  . Vaping Use: Never used  Substance and Sexual Activity  . Alcohol use: No  . Drug use: No  . Sexual activity: Not on file  Other Topics Concern  . Not on file  Social History Narrative   No living will--plans to do this soon   Husband to be health care POA-- alternate is daughters   Would accept resuscitation   Would accept feeding tube   Social Determinants of Health   Financial Resource Strain: Not on file  Food Insecurity: Not on file  Transportation Needs: Not on file  Physical Activity: Not on file  Stress: Not on file  Social Connections: Not on file  Intimate Partner Violence:  Not on file   Review of Systems Weight up a little---knows she needs to do better with lifestyle Sleeps well Cooks mostly--lots of fried food Drinks a lot of sugared ginger ale Edema better since on spironolactone Tolerating twice a week statin    Objective:   Physical Exam Constitutional:      Appearance: Normal appearance.  Cardiovascular:     Rate and Rhythm: Normal rate and regular rhythm.     Heart sounds: No murmur heard. No gallop.   Pulmonary:     Effort: Pulmonary effort is normal.     Breath sounds: Normal breath sounds. No wheezing or rales.  Musculoskeletal:     Cervical back: Neck supple.     Comments: Slight puffiness in feet only  Lymphadenopathy:     Cervical: No cervical adenopathy.  Neurological:     Mental Status: She is alert.            Assessment & Plan:

## 2020-09-30 NOTE — Assessment & Plan Note (Signed)
BP Readings from Last 3 Encounters:  09/30/20 140/86  08/30/20 (!) 152/70  11/03/19 (!) 168/78   Better now Will continue current meds---check labs

## 2021-09-07 ENCOUNTER — Other Ambulatory Visit: Payer: Self-pay | Admitting: Internal Medicine

## 2021-09-17 ENCOUNTER — Other Ambulatory Visit: Payer: Self-pay | Admitting: Internal Medicine

## 2021-10-03 ENCOUNTER — Encounter: Payer: Medicare Other | Admitting: Internal Medicine

## 2021-10-25 ENCOUNTER — Encounter: Payer: Self-pay | Admitting: Internal Medicine

## 2021-10-25 ENCOUNTER — Ambulatory Visit (INDEPENDENT_AMBULATORY_CARE_PROVIDER_SITE_OTHER): Payer: Medicare HMO | Admitting: Internal Medicine

## 2021-10-25 VITALS — BP 110/62 | HR 60 | Temp 97.8°F | Ht 65.75 in | Wt 213.0 lb

## 2021-10-25 DIAGNOSIS — E0500 Thyrotoxicosis with diffuse goiter without thyrotoxic crisis or storm: Secondary | ICD-10-CM

## 2021-10-25 DIAGNOSIS — I10 Essential (primary) hypertension: Secondary | ICD-10-CM

## 2021-10-25 DIAGNOSIS — E2839 Other primary ovarian failure: Secondary | ICD-10-CM

## 2021-10-25 DIAGNOSIS — Z0000 Encounter for general adult medical examination without abnormal findings: Secondary | ICD-10-CM | POA: Diagnosis not present

## 2021-10-25 DIAGNOSIS — Z7189 Other specified counseling: Secondary | ICD-10-CM

## 2021-10-25 DIAGNOSIS — Z8673 Personal history of transient ischemic attack (TIA), and cerebral infarction without residual deficits: Secondary | ICD-10-CM

## 2021-10-25 LAB — COMPREHENSIVE METABOLIC PANEL
ALT: 19 U/L (ref 0–35)
AST: 18 U/L (ref 0–37)
Albumin: 4.1 g/dL (ref 3.5–5.2)
Alkaline Phosphatase: 37 U/L — ABNORMAL LOW (ref 39–117)
BUN: 16 mg/dL (ref 6–23)
CO2: 29 mEq/L (ref 19–32)
Calcium: 9.1 mg/dL (ref 8.4–10.5)
Chloride: 102 mEq/L (ref 96–112)
Creatinine, Ser: 0.87 mg/dL (ref 0.40–1.20)
GFR: 67.36 mL/min (ref >60.00)
Glucose, Bld: 77 mg/dL (ref 70–99)
Potassium: 4.2 mEq/L (ref 3.5–5.1)
Sodium: 140 mEq/L (ref 135–145)
Total Bilirubin: 0.3 mg/dL (ref 0.2–1.2)
Total Protein: 7.6 g/dL (ref 6.0–8.3)

## 2021-10-25 LAB — LIPID PANEL
Cholesterol: 118 mg/dL (ref 0–200)
HDL: 41 mg/dL (ref >39.00)
LDL Cholesterol: 63 mg/dL (ref 0–99)
NonHDL: 77.43
Total CHOL/HDL Ratio: 3
Triglycerides: 70 mg/dL (ref 0.0–149.0)
VLDL: 14 mg/dL (ref 0.0–40.0)

## 2021-10-25 LAB — CBC
HCT: 41.4 % (ref 36.0–46.0)
Hemoglobin: 13 g/dL (ref 12.0–15.0)
MCHC: 31.3 g/dL (ref 30.0–36.0)
MCV: 82.9 fl (ref 78.0–100.0)
Platelets: 196 10*3/uL (ref 150.0–400.0)
RBC: 5 Mil/uL (ref 3.87–5.11)
RDW: 16.5 % — ABNORMAL HIGH (ref 11.5–15.5)
WBC: 4.8 10*3/uL (ref 4.0–10.5)

## 2021-10-25 LAB — TSH: TSH: 0.86 u[IU]/mL (ref 0.35–5.50)

## 2021-10-25 LAB — T4, FREE: Free T4: 0.86 ng/dL (ref 0.60–1.60)

## 2021-10-25 NOTE — Assessment & Plan Note (Signed)
BP Readings from Last 3 Encounters:  10/25/21 110/62  09/30/20 140/86  08/30/20 (!) 152/70   Good control on amlodipine '10mg'$ , spironolactone 25 and losartan/HCTZ 100/25 daily Will check labs

## 2021-10-25 NOTE — Assessment & Plan Note (Signed)
Had treatment Seems euthyroid --will check labs

## 2021-10-25 NOTE — Assessment & Plan Note (Signed)
After COVID vaccine Is on statin (rosuvastatin 10 twice a week) ASA in BC's

## 2021-10-25 NOTE — Progress Notes (Signed)
Subjective:    Patient ID: Kristine Hammond, female    DOB: 1951/03/24, 71 y.o.   MRN: [id number]  HPI Here for Medicare wellness visit and follow up of chronic health conditions Reviewed advanced directives Reviewed other doctors--- No hospitalizations or surgery in past year Vision is okay---overdue for eye exam Hearing is fine Not really exercising---just does gardening---discussed No alcohol or tobacco No falls No depression or anhedonia Independent with instrumental ADLs No sig memory issues--just recall  No new problems No problems with rosuvastatin No new neurologic symptoms or TIA Takes BC just about every morning  No chest pain No SOB No dizziness or syncope Occasional brief edema No palpitations--but can occasionally hear it beating when waking at night  Current Outpatient Medications on File Prior to Visit  Medication Sig Dispense Refill   amLODipine (NORVASC) 10 MG tablet TAKE 1 TABLET BY MOUTH EVERY DAY 90 tablet 0   Aspirin-Salicylamide-Caffeine (BC HEADACHE POWDER PO) Take 1 Dose by mouth daily.     losartan-hydrochlorothiazide (HYZAAR) 100-25 MG tablet TAKE 1 TABLET BY MOUTH EVERY DAY 90 tablet 0   Multiple Vitamin (MULTIVITAMIN) tablet Take 1 tablet by mouth daily.     rosuvastatin (CRESTOR) 10 MG tablet TAKE 1 TABLET (10 MG TOTAL) BY MOUTH 2 (TWO) TIMES A WEEK. 26 tablet 0   spironolactone (ALDACTONE) 25 MG tablet TAKE 1 TABLET (25 MG TOTAL) BY MOUTH DAILY. 90 tablet 0   No current facility-administered medications on file prior to visit.    Allergies  Allergen Reactions   Atorvastatin Other (See Comments)    Past Medical History:  Diagnosis Date   Grave's disease    treated with RAI   Hyperlipidemia    Hypertension    Vaginal delivery     x 2    Past Surgical History:  Procedure Laterality Date   BREAST BIOPSY Right    BREAST SURGERY Right 2014   rt breast biopsy   gyn surgery     hysterectomy form fibroids    Family History   Problem Relation Age of Onset   Stroke Mother    Stroke Father    Stroke Brother    Stroke Sister    Stroke Sister    Cancer Maternal Grandfather        bone   Colon cancer Neg Hx    Colon polyps Neg Hx    Stomach cancer Neg Hx    Esophageal cancer Neg Hx     Social History   Socioeconomic History   Marital status: Married    Spouse name: Not on file   Number of children: 2   Years of education: Not on file   Highest education level: Not on file  Occupational History   Occupation: [employer]    Comment: Retired  Tobacco Use   Smoking status: Former    Passive exposure: Never   Smokeless tobacco: Never  Scientific laboratory technician Use: Never used  Substance and Sexual Activity   Alcohol use: No   Drug use: No   Sexual activity: Not on file  Other Topics Concern   Not on file  Social History Narrative   No living will--plans to do this soon   Husband to be health care POA-- alternate is daughters   Would accept resuscitation   Would accept feeding tube   Social Determinants of Health   Financial Resource Strain: Not on file  Food Insecurity: Not on file  Transportation Needs: Not on file  Physical Activity: Not on file  Stress: Not on file  Social Connections: Not on file  Intimate Partner Violence: Not on file   Review of Systems Appetite is good Weight is stable Sleeps well Mostly wears seat belt Needs some dental work--but has "dental phobia". Going to Dr Eugenie Birks No heartburn or dysphagia Bowels move fine--no blood Some urinary urgency. Some mixed incontinence--wears pad especially at night No suspicious skin lesions Some left arm pain--down from shoulder (intermittent). Notes at night mostly. Some hand numbness also----discussed wrist splint if CTS    Objective:   Physical Exam Constitutional:      Appearance: Normal appearance.  HENT:     Mouth/Throat:     Comments: No lesions Eyes:     Conjunctiva/sclera: Conjunctivae normal.      Pupils: Pupils are equal, round, and reactive to light.  Cardiovascular:     Rate and Rhythm: Normal rate and regular rhythm.     Pulses: Normal pulses.     Heart sounds: No murmur heard.    No gallop.  Pulmonary:     Effort: Pulmonary effort is normal.     Breath sounds: Normal breath sounds. No wheezing or rales.  Abdominal:     Palpations: Abdomen is soft.     Tenderness: There is no abdominal tenderness.  Musculoskeletal:     Cervical back: Neck supple.     Right lower leg: No edema.     Left lower leg: No edema.  Lymphadenopathy:     Cervical: No cervical adenopathy.  Skin:    Findings: No lesion or rash.  Neurological:     General: No focal deficit present.     Mental Status: She is alert and oriented to person, place, and time.     Comments: Mini-cog normal  Psychiatric:        Behavior: Behavior normal.            Assessment & Plan:

## 2021-10-25 NOTE — Patient Instructions (Signed)
Please call [facility] to set up bone density test and mammogram.

## 2021-10-25 NOTE — Progress Notes (Signed)
Vision Screening   Right eye Left eye Both eyes  Without correction '20/30 20/25 20/30 '$  With correction     Hearing Screening - Comments:: Passed whisper test

## 2021-10-25 NOTE — Assessment & Plan Note (Signed)
See social history 

## 2021-10-25 NOTE — Assessment & Plan Note (Signed)
I have personally reviewed the Medicare Annual Wellness questionnaire and have noted 1. The patient's medical and social history 2. Their use of alcohol, tobacco or illicit drugs 3. Their current medications and supplements 4. The patient's functional ability including ADL's, fall risks, home safety risks and hearing or visual             impairment. 5. Diet and physical activities 6. Evidence for depression or mood disorders  The patients weight, height, BMI and visual acuity have been recorded in the chart I have made referrals, counseling and provided education to the patient based review of the above and I have provided the pt with a written personalized care plan for preventive services.  I have provided you with a copy of your personalized plan for preventive services. Please take the time to review along with your updated medication list.  Overdue for mammogram---she will set up Needs baseline DEXA Discussed increasing exercise No COVID vaccine--had neuro event after  Flu vaccine in the fall Td at pharmacy--consider shingrix

## 2021-12-08 ENCOUNTER — Other Ambulatory Visit: Payer: Self-pay | Admitting: Internal Medicine

## 2021-12-15 ENCOUNTER — Other Ambulatory Visit: Payer: Self-pay | Admitting: Internal Medicine

## 2021-12-16 ENCOUNTER — Other Ambulatory Visit: Payer: Self-pay | Admitting: Internal Medicine

## 2021-12-29 ENCOUNTER — Other Ambulatory Visit: Payer: Medicare HMO

## 2022-02-07 ENCOUNTER — Other Ambulatory Visit: Payer: Medicare HMO

## 2022-04-06 ENCOUNTER — Other Ambulatory Visit: Payer: Self-pay | Admitting: Internal Medicine

## 2022-08-10 ENCOUNTER — Ambulatory Visit: Payer: Medicare HMO | Admitting: Podiatry

## 2022-08-10 DIAGNOSIS — L600 Ingrowing nail: Secondary | ICD-10-CM | POA: Diagnosis not present

## 2022-08-10 NOTE — Progress Notes (Signed)
   Chief Complaint  Patient presents with   Ingrown Toenail    Left foot hallux ingrown toe nail started month ago, patient has had some swelling and drainage,     Subjective: Patient presents today for evaluation of pain to the medial border left great toe. Patient is concerned for possible ingrown nail.  It is very sensitive to touch.  Patient presents today for further treatment and evaluation.  Patient also states that she has a symptomatic bunion to the left foot.  She like to have it evaluated today as well.  She does have history of bunionectomy surgery to the right foot with good success several years prior.  Past Medical History:  Diagnosis Date   Grave's disease    treated with RAI   Hyperlipidemia    Hypertension    Vaginal delivery     x 2    Objective:  General: Well developed, nourished, in no acute distress, alert and oriented x3   Dermatology: Skin is warm, dry and supple bilateral.  Medial border left great toe is tender with evidence of an ingrowing nail. Pain on palpation noted to the border of the nail fold. The remaining nails appear unremarkable at this time. There are no open sores, lesions.  Vascular: DP and PT pulses palpable.  No clinical evidence of vascular compromise  Neruologic: Grossly intact via light touch bilateral.  Musculoskeletal: Hallux valgus deformity noted left foot.  Prior history of bunionectomy surgery right foot with good rectus alignment of the toe  Assesement: #1 Paronychia with ingrowing nail medial border left great toe  Plan of Care:  1. Patient evaluated.  2. Discussed treatment alternatives and plan of care. Explained nail avulsion procedure and post procedure course to patient. 3. Patient opted for permanent partial nail avulsion of the ingrown portion of the nail.  4. Prior to procedure, local anesthesia infiltration utilized using 3 ml of a 50:50 mixture of 2% plain lidocaine and 0.5% plain marcaine in a normal hallux block  fashion and a betadine prep performed.  5. Partial permanent nail avulsion with chemical matrixectomy performed using 3x30sec applications of phenol followed by alcohol flush.  6. Light dressing applied.  Post care instructions provided 7.  Return to clinic 3 weeks for follow-up and to evaluate left foot bunion.  X-rays will be needed at this time.  She states that she is ready to proceed with surgery and we will discuss surgical consent next appointment  Felecia Shelling, DPM [facility]  Dr. Felecia Shelling, DPM    [address] [place], [place] [address]                Office [phone]  Fax [phone]

## 2022-09-11 ENCOUNTER — Ambulatory Visit: Payer: Medicare HMO | Admitting: Podiatry

## 2022-09-11 DIAGNOSIS — L600 Ingrowing nail: Secondary | ICD-10-CM

## 2022-09-11 NOTE — Progress Notes (Signed)
   Chief Complaint  Patient presents with   Ingrown Toenail    Patient came in today for left foot hallux ingrown removal follow-up     Subjective: 72 y.o. female presents today status post permanent nail avulsion procedure of the medial border of the left great toe that was performed on 08/10/2022. Patient doing well. No pain. She soaked her foot and applied the antibiotic ointment as instructed.    Past Medical History:  Diagnosis Date   Grave's disease    treated with RAI   Hyperlipidemia    Hypertension    Vaginal delivery     x 2    Objective: Neurovascular status intact.  Skin is warm, dry and supple. Nail and respective nail fold appears to be healing appropriately.   Assessment: #1 s/p partial permanent nail matrixectomy medial border left great toe. 08/10/2022   Plan of care: #1 patient was evaluated  #2 light debridement of the periungual debris was performed to the border of the respective toe and nail plate using a tissue nipper. #3 patient is to return to clinic on a PRN basis.   Felecia Shelling, DPM [facility]  Dr. Felecia Shelling, DPM    [address] [place], [place] [address]                Office [phone]  Fax [phone]

## 2022-10-30 ENCOUNTER — Encounter: Payer: Medicare HMO | Admitting: Internal Medicine

## 2022-10-31 ENCOUNTER — Encounter: Payer: Self-pay | Admitting: Internal Medicine

## 2022-11-29 ENCOUNTER — Encounter: Payer: Self-pay | Admitting: Internal Medicine

## 2022-11-29 ENCOUNTER — Ambulatory Visit (INDEPENDENT_AMBULATORY_CARE_PROVIDER_SITE_OTHER): Payer: Medicare HMO | Admitting: Internal Medicine

## 2022-11-29 VITALS — BP 138/86 | HR 79 | Temp 97.8°F | Ht 64.75 in | Wt 219.0 lb

## 2022-11-29 DIAGNOSIS — Z1159 Encounter for screening for other viral diseases: Secondary | ICD-10-CM

## 2022-11-29 DIAGNOSIS — Z8673 Personal history of transient ischemic attack (TIA), and cerebral infarction without residual deficits: Secondary | ICD-10-CM

## 2022-11-29 DIAGNOSIS — I10 Essential (primary) hypertension: Secondary | ICD-10-CM

## 2022-11-29 DIAGNOSIS — E0500 Thyrotoxicosis with diffuse goiter without thyrotoxic crisis or storm: Secondary | ICD-10-CM | POA: Diagnosis not present

## 2022-11-29 DIAGNOSIS — R059 Cough, unspecified: Secondary | ICD-10-CM | POA: Diagnosis not present

## 2022-11-29 DIAGNOSIS — Z0000 Encounter for general adult medical examination without abnormal findings: Secondary | ICD-10-CM

## 2022-11-29 LAB — COMPREHENSIVE METABOLIC PANEL
ALT: 17 U/L (ref 0–35)
AST: 16 U/L (ref 0–37)
Albumin: 4.1 g/dL (ref 3.5–5.2)
Alkaline Phosphatase: 44 U/L (ref 39–117)
BUN: 19 mg/dL (ref 6–23)
CO2: 34 mEq/L — ABNORMAL HIGH (ref 19–32)
Calcium: 9.6 mg/dL (ref 8.4–10.5)
Chloride: 102 mEq/L (ref 96–112)
Creatinine, Ser: 0.86 mg/dL (ref 0.40–1.20)
GFR: 67.78 mL/min (ref >60.00)
Glucose, Bld: 95 mg/dL (ref 70–99)
Potassium: 3.9 mEq/L (ref 3.5–5.1)
Sodium: 144 mEq/L (ref 135–145)
Total Bilirubin: 0.3 mg/dL (ref 0.2–1.2)
Total Protein: 7.6 g/dL (ref 6.0–8.3)

## 2022-11-29 LAB — LIPID PANEL
Cholesterol: 196 mg/dL (ref 0–200)
HDL: 52.5 mg/dL (ref >39.00)
LDL Cholesterol: 127 mg/dL — ABNORMAL HIGH (ref 0–99)
NonHDL: 143.65
Total CHOL/HDL Ratio: 4
Triglycerides: 83 mg/dL (ref 0.0–149.0)
VLDL: 16.6 mg/dL (ref 0.0–40.0)

## 2022-11-29 LAB — CBC
HCT: 42.3 % (ref 36.0–46.0)
Hemoglobin: 13.2 g/dL (ref 12.0–15.0)
MCHC: 31.3 g/dL (ref 30.0–36.0)
MCV: 82.2 fl (ref 78.0–100.0)
Platelets: 213 10*3/uL (ref 150.0–400.0)
RBC: 5.14 Mil/uL — ABNORMAL HIGH (ref 3.87–5.11)
RDW: 16.5 % — ABNORMAL HIGH (ref 11.5–15.5)
WBC: 5.2 10*3/uL (ref 4.0–10.5)

## 2022-11-29 LAB — TSH: TSH: 0.76 u[IU]/mL (ref 0.35–5.50)

## 2022-11-29 LAB — T4, FREE: Free T4: 0.76 ng/dL (ref 0.60–1.60)

## 2022-11-29 NOTE — Assessment & Plan Note (Signed)
I have personally reviewed the Medicare Annual Wellness questionnaire and have noted 1. The patient's medical and social history 2. Their use of alcohol, tobacco or illicit drugs 3. Their current medications and supplements 4. The patient's functional ability including ADL's, fall risks, home safety risks and hearing or visual             impairment. 5. Diet and physical activities 6. Evidence for depression or mood disorders  The patients weight, height, BMI and visual acuity have been recorded in the chart I have made referrals, counseling and provided education to the patient based review of the above and I have provided the pt with a written personalized care plan for preventive services.  I have provided you with a copy of your personalized plan for preventive services. Please take the time to review along with your updated medication list.  Healthy but needs to work on fitness---did start walking, discussed eating Done with screening colonoscopies Really behind on mammograms--will do our mobile unit No COVID--has TIA after Update flu vaccine this fall--and RSV Td at the pharmacy Consider shingrix

## 2022-11-29 NOTE — Assessment & Plan Note (Signed)
Will check for underactive after PTU and RAI

## 2022-11-29 NOTE — Progress Notes (Signed)
Vision Screening   Right eye Left eye Both eyes  Without correction 20/30 20/30 20/30  With correction     Hearing Screening - Comments:: Passed whisper test  

## 2022-11-29 NOTE — Assessment & Plan Note (Addendum)
BP Readings from Last 3 Encounters:  11/29/22 138/86  10/25/21 110/62  09/30/20 140/86   Good control on the losartan/hydrochlorothiazide 100/25 and amlodipine Will check labs

## 2022-11-29 NOTE — Assessment & Plan Note (Signed)
Try over the counter loratadine or cetirizine at bedtime

## 2022-11-29 NOTE — Assessment & Plan Note (Signed)
BMI 36 with HTN, past TIA, etc Discussed cutting out sugar, regular walking, reducing fast food

## 2022-11-29 NOTE — Addendum Note (Signed)
Addended by: Alvina Chou on: 11/29/2022 11:30 AM   Modules accepted: Orders

## 2022-11-29 NOTE — Progress Notes (Signed)
Subjective:    Patient ID: Kristine Hammond, female    DOB: 1950/08/02, 72 y.o.   MRN: [id number]  HPI Here for Medicare wellness visit and follow up of chronic health conditions Reviewed advanced directives Reviewed other doctors---Dr Evans--podiatry, [facility], Dr Lavonna Rua No hospitalizations or surgery in the last year Vision is okay---no recent checks Hearing is fine No alcohol or tobacco Has been walking now--1-2 miles daily (recently started) No falls No depression or anhedonia Independent with instrumental ADLs No sig memory problems--just recall  Does have occasional cough--multiple times daily Not sick No apparent post nasal drip Does have allergies now she thinks--not taking anything No heartburn or dysphagia No SOB  No chest pain No palpitations No dizziness or syncope Some ankle swelling in the past---better now Still takes [facility] every morning  Weight up 6# in past year--16# in 3 years BMI now 36 Lots of fast food--and cranberry juice--discussed  Current Outpatient Medications on File Prior to Visit  Medication Sig Dispense Refill   amLODipine (NORVASC) 10 MG tablet TAKE 1 TABLET BY MOUTH EVERY DAY 90 tablet 3   Aspirin-Salicylamide-Caffeine (BC HEADACHE POWDER PO) Take 1 Dose by mouth daily.     losartan-hydrochlorothiazide (HYZAAR) 100-25 MG tablet TAKE 1 TABLET BY MOUTH EVERY DAY 90 tablet 3   Multiple Vitamin (MULTIVITAMIN) tablet Take 1 tablet by mouth daily.     rosuvastatin (CRESTOR) 10 MG tablet TAKE 1 TABLET (10 MG TOTAL) BY MOUTH 2 (TWO) TIMES A WEEK. 26 tablet 3   spironolactone (ALDACTONE) 25 MG tablet TAKE 1 TABLET (25 MG TOTAL) BY MOUTH DAILY. 90 tablet 3   No current facility-administered medications on file prior to visit.    Allergies  Allergen Reactions   Atorvastatin Other (See Comments)    Past Medical History:  Diagnosis Date   Grave's disease    treated with RAI   Hyperlipidemia    Hypertension    Vaginal delivery      x 2    Past Surgical History:  Procedure Laterality Date   BREAST BIOPSY Right    BREAST SURGERY Right 2014   rt breast biopsy   gyn surgery     hysterectomy form fibroids    Family History  Problem Relation Age of Onset   Stroke Mother    Stroke Father    Stroke Brother    Stroke Sister    Stroke Sister    Cancer Maternal Grandfather        bone   Colon cancer Neg Hx    Colon polyps Neg Hx    Stomach cancer Neg Hx    Esophageal cancer Neg Hx     Social History   Socioeconomic History   Marital status: Married    Spouse name: Not on file   Number of children: 2   Years of education: Not on file   Highest education level: Not on file  Occupational History   Occupation: [employer]    Comment: Retired  Tobacco Use   Smoking status: Former    Passive exposure: Never   Smokeless tobacco: Never  Advertising account planner   Vaping status: Never Used  Substance and Sexual Activity   Alcohol use: No   Drug use: No   Sexual activity: Not on file  Other Topics Concern   Not on file  Social History Narrative   No living will--plans to do this soon   Husband to be health care POA-- alternate is daughters   Would accept resuscitation  Would accept feeding tube   Social Determinants of Health   Financial Resource Strain: Not on file  Food Insecurity: Not on file  Transportation Needs: Not on file  Physical Activity: Not on file  Stress: Not on file  Social Connections: Not on file  Intimate Partner Violence: Not on file   Review of Systems Appetite is okay Sleeps great Wears seat belt Teeth need some work--seeing dentist Wants dark spot on right wrist checked---no derm visits Bowels move fine--no blood No urinary problems--some mixed incontinence (wears pad at night) No sig back or joint pains--just AM stiffness that the [facility] helps     Objective:   Physical Exam Constitutional:      Appearance: Normal appearance.  HENT:     Mouth/Throat:     Pharynx: No  oropharyngeal exudate or posterior oropharyngeal erythema.  Eyes:     Conjunctiva/sclera: Conjunctivae normal.     Pupils: Pupils are equal, round, and reactive to light.  Cardiovascular:     Rate and Rhythm: Normal rate and regular rhythm.     Pulses: Normal pulses.     Heart sounds: No murmur heard.    No gallop.  Pulmonary:     Effort: Pulmonary effort is normal.     Breath sounds: Normal breath sounds. No wheezing or rales.  Abdominal:     Palpations: Abdomen is soft.     Tenderness: There is no abdominal tenderness.  Musculoskeletal:     Cervical back: Neck supple.     Right lower leg: No edema.     Left lower leg: No edema.  Lymphadenopathy:     Cervical: No cervical adenopathy.  Skin:    Findings: No rash.     Comments: Hyperpigmented area volar/radial right wrist  Neurological:     General: No focal deficit present.     Mental Status: She is alert and oriented to person, place, and time.     Comments: Word naming 11/30 seconds Recall 3/3  Psychiatric:        Mood and Affect: Mood normal.        Behavior: Behavior normal.            Assessment & Plan:

## 2022-11-29 NOTE — Assessment & Plan Note (Signed)
After COVID vaccine On rosuvastatin 10mg  twice a week ASA in [facility] daily

## 2022-12-30 ENCOUNTER — Other Ambulatory Visit: Payer: Self-pay | Admitting: Internal Medicine

## 2023-01-29 ENCOUNTER — Other Ambulatory Visit: Payer: Self-pay | Admitting: Internal Medicine

## 2023-03-01 ENCOUNTER — Other Ambulatory Visit: Payer: Self-pay | Admitting: Internal Medicine

## 2023-10-09 ENCOUNTER — Telehealth: Payer: Self-pay | Admitting: Internal Medicine

## 2023-10-09 NOTE — Telephone Encounter (Signed)
 AWV scheduled for 11/26/2023

## 2023-10-09 NOTE — Telephone Encounter (Signed)
 Copied from CRM [id number]. Topic: General - Other >> Oct 09, 2023  8:14 AM Deaijah H wrote: Reason for CRM: Patient called in to return missed call from "[place]" Please call [phone]

## 2023-11-26 ENCOUNTER — Ambulatory Visit

## 2023-12-02 ENCOUNTER — Encounter: Payer: Self-pay | Admitting: Internal Medicine

## 2023-12-02 ENCOUNTER — Ambulatory Visit (INDEPENDENT_AMBULATORY_CARE_PROVIDER_SITE_OTHER): Payer: Medicare HMO | Admitting: Internal Medicine

## 2023-12-02 VITALS — BP 136/80 | HR 66 | Temp 98.6°F | Ht 64.5 in | Wt 216.0 lb

## 2023-12-02 DIAGNOSIS — I10 Essential (primary) hypertension: Secondary | ICD-10-CM

## 2023-12-02 DIAGNOSIS — Z0000 Encounter for general adult medical examination without abnormal findings: Secondary | ICD-10-CM | POA: Diagnosis not present

## 2023-12-02 DIAGNOSIS — Z6836 Body mass index (BMI) 36.0-36.9, adult: Secondary | ICD-10-CM

## 2023-12-02 DIAGNOSIS — Z8673 Personal history of transient ischemic attack (TIA), and cerebral infarction without residual deficits: Secondary | ICD-10-CM | POA: Diagnosis not present

## 2023-12-02 DIAGNOSIS — E0500 Thyrotoxicosis with diffuse goiter without thyrotoxic crisis or storm: Secondary | ICD-10-CM | POA: Diagnosis not present

## 2023-12-02 LAB — LIPID PANEL
Cholesterol: 158 mg/dL (ref 0–200)
HDL: 48.7 mg/dL (ref >39.00)
LDL Cholesterol: 97 mg/dL (ref 0–99)
NonHDL: 108.88
Total CHOL/HDL Ratio: 3
Triglycerides: 60 mg/dL (ref 0.0–149.0)
VLDL: 12 mg/dL (ref 0.0–40.0)

## 2023-12-02 LAB — COMPREHENSIVE METABOLIC PANEL WITH GFR
ALT: 11 U/L (ref 0–35)
AST: 12 U/L (ref 0–37)
Albumin: 4.1 g/dL (ref 3.5–5.2)
Alkaline Phosphatase: 44 U/L (ref 39–117)
BUN: 12 mg/dL (ref 6–23)
CO2: 34 meq/L — ABNORMAL HIGH (ref 19–32)
Calcium: 9.4 mg/dL (ref 8.4–10.5)
Chloride: 101 meq/L (ref 96–112)
Creatinine, Ser: 0.75 mg/dL (ref 0.40–1.20)
GFR: 79.32 mL/min (ref >60.00)
Glucose, Bld: 96 mg/dL (ref 70–99)
Potassium: 4 meq/L (ref 3.5–5.1)
Sodium: 141 meq/L (ref 135–145)
Total Bilirubin: 0.5 mg/dL (ref 0.2–1.2)
Total Protein: 7.3 g/dL (ref 6.0–8.3)

## 2023-12-02 LAB — CBC
HCT: 41.3 % (ref 36.0–46.0)
Hemoglobin: 12.9 g/dL (ref 12.0–15.0)
MCHC: 31.3 g/dL (ref 30.0–36.0)
MCV: 81.5 fl (ref 78.0–100.0)
Platelets: 182 K/uL (ref 150.0–400.0)
RBC: 5.06 Mil/uL (ref 3.87–5.11)
RDW: 16.4 % — ABNORMAL HIGH (ref 11.5–15.5)
WBC: 5.2 K/uL (ref 4.0–10.5)

## 2023-12-02 NOTE — Progress Notes (Signed)
Vision Screening   Right eye Left eye Both eyes  Without correction 20/30 20/30 20/30  With correction     Hearing Screening - Comments:: Passed whisper test  

## 2023-12-02 NOTE — Assessment & Plan Note (Signed)
 On rosuvastatin

## 2023-12-02 NOTE — Progress Notes (Signed)
 Subjective:    Patient ID: Kristine Hammond, female    DOB: [birth year], 73 y.o.   MRN: [id number]  HPI Here for Medicare wellness visit and follow up of chronic health conditions Reviewed advanced directives Reviewed other doctors---[facility], Dr Raliegh No hospitalizations or surgery in the past year Vision is okay--needs readers Hearing is fine No exercise No falls No depression or anhedonia No alcohol or tobacco Independent with instrumental ADLs No sig memory issues---mostly recall  Doing well No new concerns No chest pain or SOB No dizziness or syncope No edema of late No palpitations No heaaches  No neurologic symptoms TIA after COVID vaccine  Weight is stable BMI still 36 Has cut out sodas/cranberry juice  Energy levels fair No changes in skin/hair  Current Outpatient Medications on File Prior to Visit  Medication Sig Dispense Refill   amLODipine  (NORVASC ) 10 MG tablet TAKE 1 TABLET BY MOUTH EVERY DAY 90 tablet 3   Aspirin-Salicylamide-Caffeine (BC HEADACHE POWDER PO) Take 1 Dose by mouth daily.     losartan -hydrochlorothiazide  (HYZAAR) 100-25 MG tablet TAKE 1 TABLET BY MOUTH EVERY DAY 90 tablet 3   Multiple Vitamin (MULTIVITAMIN) tablet Take 1 tablet by mouth daily.     rosuvastatin  (CRESTOR ) 10 MG tablet TAKE 1 TABLET (10 MG TOTAL) BY MOUTH 2 (TWO) TIMES A WEEK. 26 tablet 2   spironolactone  (ALDACTONE ) 25 MG tablet TAKE 1 TABLET (25 MG TOTAL) BY MOUTH DAILY. 90 tablet 3   No current facility-administered medications on file prior to visit.    Allergies  Allergen Reactions   Atorvastatin  Other (See Comments)    Past Medical History:  Diagnosis Date   Grave's disease    treated with RAI   Hyperlipidemia    Hypertension    Vaginal delivery     x 2    Past Surgical History:  Procedure Laterality Date   BREAST BIOPSY Right    BREAST SURGERY Right 2014   rt breast biopsy   gyn surgery     hysterectomy form fibroids    Family  History  Problem Relation Age of Onset   Stroke Mother    Stroke Father    Stroke Brother    Stroke Sister    Stroke Sister    Cancer Maternal Grandfather        bone   Colon cancer Neg Hx    Colon polyps Neg Hx    Stomach cancer Neg Hx    Esophageal cancer Neg Hx     Social History   Socioeconomic History   Marital status: Married    Spouse name: Not on file   Number of children: 2   Years of education: Not on file   Highest education level: Some college, no degree  Occupational History   Occupation: [employer]    Comment: Retired  Tobacco Use   Smoking status: Former    Passive exposure: Never   Smokeless tobacco: Never  Advertising account planner   Vaping status: Never Used  Substance and Sexual Activity   Alcohol use: No   Drug use: No   Sexual activity: Not on file  Other Topics Concern   Not on file  Social History Narrative   No living will--plans to do this soon   Husband to be health care POA-- alternate is daughters   Would accept resuscitation   Would accept feeding tube   Social Drivers of Health   Financial Resource Strain: Low Risk  (11/26/2023)   Overall Financial Resource  Strain (CARDIA)    Difficulty of Paying Living Expenses: Not hard at all  Food Insecurity: No Food Insecurity (11/26/2023)   Hunger Vital Sign    Worried About Running Out of Food in the Last Year: Never true    Ran Out of Food in the Last Year: Never true  Transportation Needs: No Transportation Needs (11/26/2023)   PRAPARE - Administrator, Civil Service (Medical): No    Lack of Transportation (Non-Medical): No  Physical Activity: Inactive (11/26/2023)   Exercise Vital Sign    Days of Exercise per Week: 0 days    Minutes of Exercise per Session: Not on file  Stress: No Stress Concern Present (11/26/2023)   [employer] of Occupational Health - Occupational Stress Questionnaire    Feeling of Stress: Only a little  Social Connections: Socially Integrated  (11/26/2023)   Social Connection and Isolation Panel    Frequency of Communication with Friends and Family: Three times a week    Frequency of Social Gatherings with Friends and Family: Once a week    Attends Religious Services: More than 4 times per year    Active Member of [employer] or Organizations: Yes    Attends Banker Meetings: 1 to 4 times per year    Marital Status: Married  Catering manager Violence: Not on file   Review of Systems Appetite is fine Sleeps well Wears seat belt Teeth are fine--keeps up with dentist Bowels are fine--no blood No heartburn other than with pizza---no dysphagia Gets AM stiffness--no sig joint pains No skin problems    Objective:   Physical Exam Constitutional:      Appearance: Normal appearance.  HENT:     Mouth/Throat:     Pharynx: No oropharyngeal exudate or posterior oropharyngeal erythema.  Eyes:     Conjunctiva/sclera: Conjunctivae normal.     Pupils: Pupils are equal, round, and reactive to light.  Cardiovascular:     Rate and Rhythm: Normal rate and regular rhythm.     Pulses: Normal pulses.     Heart sounds: No murmur heard.    No gallop.  Pulmonary:     Effort: Pulmonary effort is normal.     Breath sounds: Normal breath sounds. No wheezing or rales.  Abdominal:     Palpations: Abdomen is soft.     Tenderness: There is no abdominal tenderness.  Musculoskeletal:     Cervical back: Neck supple.     Comments: Trace ankle edema  Lymphadenopathy:     Cervical: No cervical adenopathy.  Skin:    Findings: No rash.  Neurological:     General: No focal deficit present.     Mental Status: She is alert and oriented to person, place, and time.     Comments: Mini-cog--normal  Psychiatric:        Mood and Affect: Mood normal.        Behavior: Behavior normal.            Assessment & Plan:

## 2023-12-02 NOTE — Assessment & Plan Note (Signed)
 PTU and the RAI Will recheck levels

## 2023-12-02 NOTE — Assessment & Plan Note (Signed)
 BMI 36 with HTN, TIA, etc Discussed lifestyle

## 2023-12-02 NOTE — Assessment & Plan Note (Signed)
 BP Readings from Last 3 Encounters:  12/02/23 136/80  11/29/22 138/86  10/25/21 110/62   Controlled on amlodipine  10, losartan /hydrochlorothiazide  100/25 and spironolactone  25

## 2023-12-02 NOTE — Assessment & Plan Note (Signed)
 I have personally reviewed the Medicare Annual Wellness questionnaire and have noted 1. The patient's medical and social history 2. Their use of alcohol, tobacco or illicit drugs 3. Their current medications and supplements 4. The patient's functional ability including ADL's, fall risks, home safety risks and hearing or visual             impairment. 5. Diet and physical activities 6. Evidence for depression or mood disorders  The patients weight, height, BMI and visual acuity have been recorded in the chart I have made referrals, counseling and provided education to the patient based review of the above and I have provided the pt with a written personalized care plan for preventive services.  I have provided you with a copy of your personalized plan for preventive services. Please take the time to review along with your updated medication list.  Healthy but needs to work on fitness Done with colon cancer screening Will come here for mammogram in 4 days No pap due to age No COVID vaccine--had TIA after Flu and Td this fall Consider shingrix

## 2023-12-03 ENCOUNTER — Ambulatory Visit: Payer: Self-pay | Admitting: Internal Medicine

## 2023-12-03 LAB — T4, FREE: Free T4: 0.75 ng/dL (ref 0.60–1.60)

## 2023-12-03 LAB — TSH: TSH: 0.83 u[IU]/mL (ref 0.35–5.50)

## 2023-12-15 ENCOUNTER — Other Ambulatory Visit: Payer: Self-pay | Admitting: Internal Medicine

## 2024-01-02 ENCOUNTER — Ambulatory Visit

## 2024-01-02 VITALS — BP 136/80 | Ht 64.5 in | Wt 201.0 lb

## 2024-01-02 DIAGNOSIS — Z0000 Encounter for general adult medical examination without abnormal findings: Secondary | ICD-10-CM | POA: Diagnosis not present

## 2024-01-02 NOTE — Progress Notes (Signed)
 Because this visit was a virtual/telehealth visit,  certain criteria was not obtained, such a blood pressure, CBG if applicable, and timed get up and go. Any medications not marked as taking were not mentioned during the medication reconciliation part of the visit. Any vitals not documented were not able to be obtained due to this being a telehealth visit or patient was unable to self-report a recent blood pressure reading due to a lack of equipment at home via telehealth. Vitals that have been documented are verbally provided by the patient.  Subjective:   Kristine Hammond is a 73 y.o. who presents for a Medicare Wellness preventive visit.  As a reminder, Annual Wellness Visits don't include a physical exam, and some assessments may be limited, especially if this visit is performed virtually. We may recommend an in-person follow-up visit with your provider if needed.  Visit Complete: Virtual I connected with  Erminio JAYSON Gavel on 01/02/24 by a audio enabled telemedicine application and verified that I am speaking with the correct person using two identifiers.  Patient Location: Home  Provider Location: Home Office  I discussed the limitations of evaluation and management by telemedicine. The patient expressed understanding and agreed to proceed.  Vital Signs: Because this visit was a virtual/telehealth visit, some criteria may be missing or patient reported. Any vitals not documented were not able to be obtained and vitals that have been documented are patient reported.  VideoDeclined- This patient declined Librarian, academic. Therefore the visit was completed with audio only.  Persons Participating in Visit: Patient.  AWV Questionnaire: Yes: Patient Medicare AWV questionnaire was completed by the patient on 01/02/2024; I have confirmed that all information answered by patient is correct and no changes since this date.  Cardiac Risk Factors include: advanced age  (>61men, >45 women);obesity (BMI >30kg/m2);hypertension     Objective:    Today's Vitals   01/02/24 0938  BP: 136/80  Weight: 201 lb (91.2 kg)  Height: 5' 4.5 (1.638 m)   Body mass index is 33.97 kg/m.     01/02/2024    9:38 AM  Advanced Directives  Does Patient Have a Medical Advance Directive? Yes  Type of Estate agent of [place];Living will  Does patient want to make changes to medical advance directive? No - Patient declined  Copy of Healthcare Power of Attorney in Chart? No - copy requested    Current Medications (verified) Outpatient Encounter Medications as of 01/02/2024  Medication Sig   amLODipine  (NORVASC ) 10 MG tablet TAKE 1 TABLET BY MOUTH EVERY DAY   Aspirin-Salicylamide-Caffeine (BC HEADACHE POWDER PO) Take 1 Dose by mouth daily.   losartan -hydrochlorothiazide  (HYZAAR) 100-25 MG tablet TAKE 1 TABLET BY MOUTH EVERY DAY   Multiple Vitamin (MULTIVITAMIN) tablet Take 1 tablet by mouth daily.   rosuvastatin  (CRESTOR ) 10 MG tablet TAKE 1 TABLET (10 MG TOTAL) BY MOUTH 2 (TWO) TIMES A WEEK.   spironolactone  (ALDACTONE ) 25 MG tablet TAKE 1 TABLET (25 MG TOTAL) BY MOUTH DAILY.   No facility-administered encounter medications on file as of 01/02/2024.    Allergies (verified) Atorvastatin    History: Past Medical History:  Diagnosis Date   Grave's disease    treated with RAI   Hyperlipidemia    Hypertension    Vaginal delivery     x 2   Past Surgical History:  Procedure Laterality Date   BREAST BIOPSY Right    BREAST SURGERY Right 2014   rt breast biopsy   gyn surgery  hysterectomy form fibroids   Family History  Problem Relation Age of Onset   Stroke Mother    Stroke Father    Stroke Brother    Stroke Sister    Stroke Sister    Cancer Maternal Grandfather        bone   Colon cancer Neg Hx    Colon polyps Neg Hx    Stomach cancer Neg Hx    Esophageal cancer Neg Hx    Social History   Socioeconomic History   Marital  status: Married    Spouse name: Not on file   Number of children: 2   Years of education: Not on file   Highest education level: Some college, no degree  Occupational History   Occupation: [employer]    Comment: Retired  Tobacco Use   Smoking status: Former    Passive exposure: Never   Smokeless tobacco: Never  Advertising account planner   Vaping status: Never Used  Substance and Sexual Activity   Alcohol use: No   Drug use: No   Sexual activity: Not on file  Other Topics Concern   Not on file  Social History Narrative   No living will--plans to do this soon   Husband to be health care POA-- alternate is daughters   Would accept resuscitation   Would accept feeding tube--but not prolonged   Social Drivers of Corporate investment banker Strain: Low Risk  (01/02/2024)   Overall Financial Resource Strain (CARDIA)    Difficulty of Paying Living Expenses: Not hard at all  Food Insecurity: No Food Insecurity (01/02/2024)   Hunger Vital Sign    Worried About Running Out of Food in the Last Year: Never true    Ran Out of Food in the Last Year: Never true  Transportation Needs: No Transportation Needs (01/02/2024)   PRAPARE - Administrator, Civil Service (Medical): No    Lack of Transportation (Non-Medical): No  Physical Activity: Inactive (01/02/2024)   Exercise Vital Sign    Days of Exercise per Week: 0 days    Minutes of Exercise per Session: Not on file  Stress: No Stress Concern Present (01/02/2024)   [employer] of Occupational Health - Occupational Stress Questionnaire    Feeling of Stress: Only a little  Social Connections: Socially Integrated (01/02/2024)   Social Connection and Isolation Panel    Frequency of Communication with Friends and Family: Three times a week    Frequency of Social Gatherings with Friends and Family: Once a week    Attends Religious Services: More than 4 times per year    Active Member of [employer] or Organizations: Yes    Attends Occupational hygienist Meetings: 1 to 4 times per year    Marital Status: Married    Tobacco Counseling Counseling given: Not Answered    Clinical Intake:  Pre-visit preparation completed: Yes  Pain : No/denies pain     BMI - recorded: 33.97 Nutritional Status: BMI > 30  Obese Nutritional Risks: None Diabetes: No  No results found for: HGBA1C   How often do you need to have someone help you when you read instructions, pamphlets, or other written materials from your doctor or pharmacy?: 1 - Never What is the last grade level you completed in school?: Some college  Interpreter Needed?: No  Information entered by :: Lyle Anderson LATHER   Activities of Daily Living     01/02/2024    8:46 AM 11/26/2023    9:11  AM  In your present state of health, do you have any difficulty performing the following activities:  Hearing? 0 0  Vision? 0 0  Difficulty concentrating or making decisions? 0 0  Walking or climbing stairs? 0 0  Dressing or bathing? 0 0  Doing errands, shopping? 0 0  Preparing Food and eating ? N N  Using the Toilet? N N  In the past six months, have you accidently leaked urine? Y Y  Do you have problems with loss of bowel control? N N  Managing your Medications? N N  Managing your Finances? N N  Housekeeping or managing your Housekeeping? N N    Patient Care Team: Jimmy Charlie FERNS, MD as PCP - General Sankar, Seeplaputhur G, MD (General Surgery)  I have updated your Care Teams any recent Medical Services you may have received from other providers in the past year.     Assessment:   This is a routine wellness examination for Kenna.  Hearing/Vision screen Hearing Screening - Comments:: No difficulties hearing Vision Screening - Comments:: Patient wears glasses    Goals Addressed             This Visit's Progress    Patient Stated       To get out of debt        Depression Screen     01/02/2024    9:41 AM 12/02/2023   10:29 AM 12/02/2023    10:16 AM 11/29/2022   10:27 AM 11/29/2022   10:10 AM 10/25/2021   12:28 PM 08/30/2020   12:01 PM  PHQ 2/9 Scores  PHQ - 2 Score 0 0 0 0 0 0 0  PHQ- 9 Score 1          Fall Risk     01/02/2024    8:46 AM 12/02/2023   10:29 AM 12/02/2023   10:15 AM 11/26/2023    9:11 AM 11/29/2022   10:27 AM  Fall Risk   Falls in the past year? 0 0 0 0 0  Number falls in past yr: 0  0 0   Injury with Fall? 0  0 0   Risk for fall due to : History of fall(s)  No Fall Risks    Follow up Education provided  Falls evaluation completed      MEDICARE RISK AT HOME:  Medicare Risk at Home Any stairs in or around the home?: (Patient-Rptd) No If so, are there any without handrails?: No Home free of loose throw rugs in walkways, pet beds, electrical cords, etc?: (Patient-Rptd) No Adequate lighting in your home to reduce risk of falls?: (Patient-Rptd) Yes Life alert?: (Patient-Rptd) No Use of a cane, walker or w/c?: (Patient-Rptd) No Grab bars in the bathroom?: (Patient-Rptd) Yes Shower chair or bench in shower?: (Patient-Rptd) Yes Elevated toilet seat or a handicapped toilet?: (Patient-Rptd) Yes  TIMED UP AND GO:  Was the test performed?  No  Cognitive Function: 6CIT completed        01/02/2024    9:41 AM  6CIT Screen  What Year? 0 points  What month? 0 points  What time? 0 points  Count back from 20 0 points  Months in reverse 0 points  Repeat phrase 0 points  Total Score 0 points    Immunizations Immunization History  Administered Date(s) Administered   Influenza,inj,Quad PF,6+ Mos 04/10/2019   Influenza-Unspecified 05/26/2021   PFIZER(Purple Top)SARS-COV-2 Vaccination 05/29/2019, 06/19/2019   Pneumococcal Conjugate-13 05/03/2016   Pneumococcal Polysaccharide-23 09/12/2017   Td  11/21/2007   Zoster, Live 06/12/2011    Screening Tests Health Maintenance  Topic Date Due   DEXA SCAN  Never done   INFLUENZA VACCINE  12/06/2023   MAMMOGRAM  01/04/2024 (Originally 10/24/2018)   Zoster  Vaccines- Shingrix (1 of 2) 12/01/2024 (Originally 02/05/2001)   Medicare Annual Wellness (AWV)  01/01/2025   Pneumococcal Vaccine: 50+ Years  Completed   Hepatitis C Screening  Completed   HPV VACCINES  Aged Out   Meningococcal B Vaccine  Aged Out   DTaP/Tdap/Td  Discontinued   Colonoscopy  Discontinued   COVID-19 Vaccine  Discontinued    Health Maintenance  Health Maintenance Due  Topic Date Due   DEXA SCAN  Never done   INFLUENZA VACCINE  12/06/2023   Health Maintenance Items Addressed:patient declined    Additional Screening:  Vision Screening: Recommended annual ophthalmology exams for early detection of glaucoma and other disorders of the eye. Would you like a referral to an eye doctor? No    Dental Screening: Recommended annual dental exams for proper oral hygiene  Community Resource Referral / Chronic Care Management: CRR required this visit?  No   CCM required this visit?  No   Plan:    I have personally reviewed and noted the following in the patient's chart:   Medical and social history Use of alcohol, tobacco or illicit drugs  Current medications and supplements including opioid prescriptions. Patient is not currently taking opioid prescriptions. Functional ability and status Nutritional status Physical activity Advanced directives List of other physicians Hospitalizations, surgeries, and ER visits in previous 12 months Vitals Screenings to include cognitive, depression, and falls Referrals and appointments  In addition, I have reviewed and discussed with patient certain preventive protocols, quality metrics, and best practice recommendations. A written personalized care plan for preventive services as well as general preventive health recommendations were provided to patient.   Lyle MARLA Right, [place]   01/02/2024   After Visit Summary: (MyChart) Due to this being a telephonic visit, the after visit summary with patients personalized plan was  offered to patient via MyChart   Notes: Nothing significant to report at this time.

## 2024-01-02 NOTE — Patient Instructions (Signed)
 Ms. Kristine Hammond , Thank you for taking time out of your busy schedule to complete your Annual Wellness Visit with me. I enjoyed our conversation and look forward to speaking with you again next year. I, as well as your care team,  appreciate your ongoing commitment to your health goals. Please review the following plan we discussed and let me know if I can assist you in the future. Your Game plan/ To Do List    Referrals: If you haven't heard from the office you've been referred to, please reach out to them at the phone provided.   Follow up Visits: We will see or speak with you next year for your Next Medicare AWV with our clinical staff Have you seen your provider in the last 6 months (3 months if uncontrolled diabetes)? Yes  Clinician Recommendations:  Aim for 30 minutes of exercise or brisk walking, 6-8 glasses of water, and 5 servings of fruits and vegetables each day.       This is a list of the screenings recommended for you:  Health Maintenance  Topic Date Due   DEXA scan (bone density measurement)  Never done   Flu Shot  12/06/2023   Mammogram  01/04/2024*   Zoster (Shingles) Vaccine (1 of 2) 12/01/2024*   Medicare Annual Wellness Visit  01/01/2025   Pneumococcal Vaccine for age over 49  Completed   Hepatitis C Screening  Completed   HPV Vaccine  Aged Out   Meningitis B Vaccine  Aged Out   DTaP/Tdap/Td vaccine  Discontinued   Colon Cancer Screening  Discontinued   COVID-19 Vaccine  Discontinued  *Topic was postponed. The date shown is not the original due date.    Advanced directives: (Declined) Advance directive discussed with you today. Even though you declined this today, please call our office should you change your mind, and we can give you the proper paperwork for you to fill out. Advance Care Planning is important because it:  [x]  Makes sure you receive the medical care that is consistent with your values, goals, and preferences  [x]  It provides guidance to your family  and loved ones and reduces their decisional burden about whether or not they are making the right decisions based on your wishes.  Follow the link provided in your after visit summary or read over the paperwork we have mailed to you to help you started getting your Advance Directives in place. If you need assistance in completing these, please reach out to us  so that we can help you!  See attachments for Preventive Care and Fall Prevention Tips.

## 2024-01-22 ENCOUNTER — Other Ambulatory Visit: Payer: Self-pay

## 2024-01-22 MED ORDER — LOSARTAN POTASSIUM-HCTZ 100-25 MG PO TABS: 1.0000 | ORAL_TABLET | Freq: Every day | ORAL | 3 refills | Status: AC

## 2024-01-22 NOTE — Telephone Encounter (Signed)
 Rx sent electronically.

## 2024-02-13 ENCOUNTER — Other Ambulatory Visit: Payer: Self-pay

## 2024-02-13 MED ORDER — AMLODIPINE BESYLATE 10 MG PO TABS: 10.0000 mg | ORAL_TABLET | Freq: Every day | ORAL | 1 refills | Status: AC

## 2024-02-13 NOTE — Telephone Encounter (Signed)
 Pt needs a TOC with Dr Bennett prior to the 12-03-24 OV.

## 2024-02-13 NOTE — Telephone Encounter (Signed)
 I called and left a voicemail for patient to be scheduled.

## 2024-02-14 NOTE — Telephone Encounter (Signed)
 Copied from CRM [id number]. Topic: Appointments - Transfer of Care >> Feb 13, 2024  3:23 PM Rea C wrote: Pt is requesting to transfer FROM: Jimmy Ade, MD  Pt is requesting to transfer TO: Bennett Patella, MD Reason for requested transfer: [facility] It is the responsibility of the team the patient would like to transfer to (Dr. Bennett) to reach out to the patient if for any reason this transfer is not acceptable.

## 2024-04-16 ENCOUNTER — Ambulatory Visit (INDEPENDENT_AMBULATORY_CARE_PROVIDER_SITE_OTHER)

## 2024-04-16 VITALS — BP 138/88 | HR 71 | Temp 98.1°F | Ht 64.5 in | Wt 220.0 lb

## 2024-04-16 DIAGNOSIS — E0500 Thyrotoxicosis with diffuse goiter without thyrotoxic crisis or storm: Secondary | ICD-10-CM

## 2024-04-16 DIAGNOSIS — Z8673 Personal history of transient ischemic attack (TIA), and cerebral infarction without residual deficits: Secondary | ICD-10-CM

## 2024-04-16 DIAGNOSIS — I10 Essential (primary) hypertension: Secondary | ICD-10-CM

## 2024-04-16 DIAGNOSIS — Z23 Encounter for immunization: Secondary | ICD-10-CM

## 2024-04-16 DIAGNOSIS — Z131 Encounter for screening for diabetes mellitus: Secondary | ICD-10-CM | POA: Diagnosis not present

## 2024-04-16 LAB — HEMOGLOBIN A1C: Hgb A1c MFr Bld: 6.5 % (ref 4.6–6.5)

## 2024-04-16 NOTE — Addendum Note (Signed)
 Addended by: KALLIE CLOTILDA SQUIBB on: 04/16/2024 12:03 PM   Modules accepted: Orders

## 2024-04-16 NOTE — Progress Notes (Signed)
 Will get flu shot and Prevnar 20 today.

## 2024-04-16 NOTE — Progress Notes (Signed)
 Subjective:   This visit was conducted in person. The patient gave informed consent to the use of Abridge AI technology to record the contents of the encounter as documented below.   Patient ID: Kristine Hammond, female    DOB: Jan 12, 1951, 73 y.o.   MRN: [id number]   Discussed the use of AI scribe software for clinical note transcription with the patient, who gave verbal consent to proceed.  History of Present Illness Kristine Hammond is a 73 year old female who presents for medication management and follow-up.  She has a history of hypertension, currently well-controlled with a blood pressure reading of 138/88 mmHg. Her medications include spironolactone  25 mg, Hyzaar (losartan  and hydrochlorothiazide ) one tablet daily, and amlodipine  10 mg daily. She has no current leg swelling, which she experienced in the past.  She has a history of Zieske' disease, treated with radioactive iodine over ten years ago. Recent thyroid  labs were reported as okay.  In 2021, she experienced a transient ischemic attack (TIA). She takes BC headache powder daily instead of baby aspirin. There is a strong family history of stroke, with her mother, father, sister, and brother having experienced strokes.  She is currently taking Crestor  (rosuvastatin ) 10 mg twice a week for cholesterol management due to muscle stiffness with daily dosing. Her recent cholesterol levels were acceptable, but she remains at high risk due to her medical history.  She underwent a breast biopsy on the right side and a hysterectomy for fibroids in the past.  She reports a recent illness two weeks ago, with residual coughing that is less intense and managed with over-the-counter Tessalon Perles.    Review of Systems  All other systems reviewed and are negative.       Allergies[1]  Medications Ordered Prior to Encounter[2]  BP 138/88 (BP Location: Left Arm, Patient Position: Sitting, Cuff Size: Large)   Pulse 71   Temp 98.1 F  (36.7 C) (Oral)   Ht 5' 4.5 (1.638 m)   Wt 220 lb (99.8 kg)   SpO2 94%   BMI 37.18 kg/m   Objective:      Physical Exam GENERAL: Alert, cooperative, well developed, no acute distress. Obese HEAD: Normocephalic atraumatic. EYES: Extraocular movements intact bilaterally, pupils round, equal and reactive to light bilaterally, conjunctivae normal bilaterally. CARDIOVASCULAR: Normal heart rate and rhythm, S1 and S2 normal without murmurs. CHEST: Clear to auscultation bilaterally, no wheezes, rhonchi, or crackles. EXTREMITIES: No cyanosis or edema. NEUROLOGICAL: Oriented to person, place and time, no gait abnormalities, moves all extremities without gross motor or sensory deficit.       Assessment & Plan:   Assessment & Plan History of transient ischemic attack Reportedly back in 2021. Discussed aspirin's role in preventing clots and stroke. Through shared decision making with pt, decided Benefits outweigh risks due to significant family hx of strokes and TIA history. Will rule out undiagnosed diabetes as a potential risk factor, despite advanced age, per chart review A1c has never been done.   - Check aspirin dose in BC powder and report via MyChart. - Consider starting aspirin if BC powder aspirin dose is insufficient.  Essential hypertension Blood pressure well-controlled. Potassium and renal fxn normal. - Continue current antihypertensive regimen: Losartan -HCTZ 100-25 mg daily, spironolactone  25 mg daily, amlodipine  10 mg daily  Hyperlipidemia Cholesterol levels earlier this year reveal ASCVD score 12.3%, doubtful that taking Crestor  10mg  twice weekly is adequate protection, she is high risk due to age, TIA, hypertension. Discussed fluvastatin as alternative to  Crestor , less likely to cause myalgias which she has had in the past.  - Prescribed fluvastatin extended release, one tablet daily. - Stop Crestor  upon receiving fluvastatin.  Stanczyk' disease, status post radioactive  iodine therapy Thyroid  labs normal, no current issues.  General health maintenance Due for tetanus booster and new shingles vaccine. Received flu and pneumonia vaccines today. Declined COVID booster.  - Get tetanus booster at pharmacy. - Get new shingles vaccine at pharmacy.   Return in about 33 weeks (around 12/03/2024) for CPE, fasting labs 1 wk prior.   Kengo Sturges K Renata Gambino, MD  04/16/2024     Contains text generated by Abridge.       [1]  Allergies Allergen Reactions   Atorvastatin  Other (See Comments)  [2]  Current Outpatient Medications on File Prior to Visit  Medication Sig Dispense Refill   amLODipine  (NORVASC ) 10 MG tablet Take 1 tablet (10 mg total) by mouth daily. 90 tablet 1   Aspirin-Salicylamide-Caffeine (BC HEADACHE POWDER PO) Take 1 Dose by mouth daily.     losartan -hydrochlorothiazide  (HYZAAR) 100-25 MG tablet Take 1 tablet by mouth daily. 90 tablet 3   Multiple Vitamin (MULTIVITAMIN) tablet Take 1 tablet by mouth daily.     spironolactone  (ALDACTONE ) 25 MG tablet TAKE 1 TABLET (25 MG TOTAL) BY MOUTH DAILY. 90 tablet 3   No current facility-administered medications on file prior to visit.

## 2024-04-16 NOTE — Patient Instructions (Addendum)
 Thank you for visiting  Healthcare today! Here's what we talked about: - STOP Crestor  only once you have received Fluvastatin - My Chart me aspirin dose on supplement - Get Tetanus and shingles, vaccine from pharmacy

## 2024-04-19 ENCOUNTER — Ambulatory Visit: Payer: Self-pay

## 2024-04-20 NOTE — Progress Notes (Signed)
 Pt called and schedule appt

## 2024-04-24 ENCOUNTER — Ambulatory Visit

## 2024-05-29 ENCOUNTER — Ambulatory Visit

## 2024-05-29 VITALS — BP 132/76 | HR 71 | Temp 98.2°F | Ht 64.5 in | Wt 216.0 lb

## 2024-05-29 DIAGNOSIS — R0989 Other specified symptoms and signs involving the circulatory and respiratory systems: Secondary | ICD-10-CM

## 2024-05-29 DIAGNOSIS — R053 Chronic cough: Secondary | ICD-10-CM

## 2024-05-29 DIAGNOSIS — E119 Type 2 diabetes mellitus without complications: Secondary | ICD-10-CM | POA: Diagnosis not present

## 2024-05-29 DIAGNOSIS — Z6836 Body mass index (BMI) 36.0-36.9, adult: Secondary | ICD-10-CM | POA: Diagnosis not present

## 2024-05-29 DIAGNOSIS — I10 Essential (primary) hypertension: Secondary | ICD-10-CM

## 2024-05-29 DIAGNOSIS — E669 Obesity, unspecified: Secondary | ICD-10-CM

## 2024-05-29 DIAGNOSIS — E7849 Other hyperlipidemia: Secondary | ICD-10-CM | POA: Diagnosis not present

## 2024-05-29 LAB — MICROALBUMIN / CREATININE URINE RATIO
Creatinine,U: 97 mg/dL
Microalb Creat Ratio: 23.9 mg/g (ref 0.0–30.0)
Microalb, Ur: 2.3 mg/dL — ABNORMAL HIGH (ref 0.7–1.9)

## 2024-05-29 MED ORDER — FLUTICASONE PROPIONATE 50 MCG/ACT NA SUSP: 1.0000 | Freq: Every day | NASAL | 6 refills | Status: AC

## 2024-05-29 NOTE — Progress Notes (Signed)
 "  Subjective:   This visit was conducted in person. The patient gave informed consent to the use of [employer] AI technology to record the contents of the encounter as documented below.   Patient ID: Kristine Hammond, female    DOB: 05/27/1950, 74 y.o.   MRN: [id number]   Kristine Hammond is a very pleasant 74 y.o. female who presents today for DM visit.  Discussed the use of AI scribe software for clinical note transcription with the patient, who gave verbal consent to proceed.  History of Present Illness Kristine Hammond is a 74 year old female who presents for diabetes management and medication review.  She has a history of transient ischemic attack (TIA) and a family history of strokes. She uses BC powder, which contains 100 mg of aspirin, daily for joint pain relief due to arthritis, finding it effective for morning stiffness.  She recently started taking fluvastatin for cholesterol management without experiencing side effects.  Her HbA1c is 6.5%, and she is not currently on medication for diabetes. She has quit drinking sodas and does not eat bread at all. She is not currently exercising. Her BMI is 36, and she enjoys vegetables like cabbage and beans.  She experiences a persistent cough intermittently, sometimes while sitting and watching TV, along with a runny nose and occasional post-nasal drip, which she attributes to possible allergies. No heartburn or acid reflux.  No numbness or tingling in her feet and no recent changes in vision. She does not have a regular eye doctor. She does not clean between her toes. No tingling, burning, or itching between the toes. She has received her flu and pneumonia vaccinations.     -Denies polydipsia, polyuria, weight loss, fatigue or any LE wounds.    Review of Systems  All other systems reviewed and are negative.        Past Medical History:  Diagnosis Date   Grave's disease    treated with RAI   Hyperlipidemia    Hypertension     Vaginal delivery     x 2    Social History   Socioeconomic History   Marital status: Married    Spouse name: Not on file   Number of children: 2   Years of education: Not on file   Highest education level: Some college, no degree  Occupational History   Occupation: [employer]    Comment: Retired  Tobacco Use   Smoking status: Former    Passive exposure: Never   Smokeless tobacco: Never  Advertising Account Planner   Vaping status: Never Used  Substance and Sexual Activity   Alcohol use: No   Drug use: No   Sexual activity: Not on file  Other Topics Concern   Not on file  Social History Narrative   No living will--plans to do this soon   Husband to be health care POA-- alternate is daughters   Would accept resuscitation   Would accept feeding tube--but not prolonged   Social Drivers of Health   Tobacco Use: Medium Risk (05/29/2024)   Patient History    Smoking Tobacco Use: Former    Smokeless Tobacco Use: Never    Passive Exposure: Never  Physicist, Medical Strain: Low Risk (05/25/2024)   Overall Financial Resource Strain (CARDIA)    Difficulty of Paying Living Expenses: Not hard at all  Food Insecurity: No Food Insecurity (05/25/2024)   Epic    Worried About Radiation Protection Practitioner of Food in the Last Year: Never true  Ran Out of Food in the Last Year: Never true  Transportation Needs: No Transportation Needs (05/25/2024)   Epic    Lack of Transportation (Medical): No    Lack of Transportation (Non-Medical): No  Physical Activity: Inactive (05/25/2024)   Exercise Vital Sign    Days of Exercise per Week: 0 days    Minutes of Exercise per Session: Not on file  Stress: No Stress Concern Present (05/25/2024)   [employer] of Occupational Health - Occupational Stress Questionnaire    Feeling of Stress: Only a little  Social Connections: Moderately Isolated (05/25/2024)   Social Connection and Isolation Panel    Frequency of Communication with Friends and Family: Once a week     Frequency of Social Gatherings with Friends and Family: Once a week    Attends Religious Services: More than 4 times per year    Active Member of [employer] or Organizations: No    Attends Banker Meetings: Not on file    Marital Status: Married  Intimate Partner Violence: Not At Risk (01/02/2024)   Epic    Fear of Current or Ex-Partner: No    Emotionally Abused: No    Physically Abused: No    Sexually Abused: No  Depression (PHQ2-9): Low Risk (05/29/2024)   Depression (PHQ2-9)    PHQ-2 Score: 0  Alcohol Screen: Low Risk (01/02/2024)   Alcohol Screen    Last Alcohol Screening Score (AUDIT): 0  Housing: Unknown (05/25/2024)   Epic    Unable to Pay for Housing in the Last Year: No    Number of Times Moved in the Last Year: Not on file    Homeless in the Last Year: No  Utilities: Not At Risk (01/02/2024)   Epic    Threatened with loss of utilities: No  Health Literacy: Adequate Health Literacy (01/02/2024)   B1300 Health Literacy    Frequency of need for help with medical instructions: Never    Past Surgical History:  Procedure Laterality Date   BREAST BIOPSY Right    BREAST SURGERY Right 2014   rt breast biopsy   gyn surgery     hysterectomy form fibroids    Family History  Problem Relation Age of Onset   Stroke Mother    Stroke Father    Stroke Brother    Stroke Sister    Stroke Sister    Cancer Maternal Grandfather        bone   Colon cancer Neg Hx    Colon polyps Neg Hx    Stomach cancer Neg Hx    Esophageal cancer Neg Hx     Allergies[1]  Medications Ordered Prior to Encounter[2]  BP 132/76 (BP Location: Left Arm, Patient Position: Sitting, Cuff Size: Large)   Pulse 71   Temp 98.2 F (36.8 C) (Oral)   Ht 5' 4.5 (1.638 m)   Wt 216 lb (98 kg)   SpO2 92%   BMI 36.50 kg/m   Objective:     Physical Exam VITALS: BP- 132/76 MEASUREMENTS: BMI- 36.0. GENERAL: Alert, cooperative, well developed, no acute distress. HEAD: Normocephalic  atraumatic. EYES: Extraocular movements intact bilaterally, pupils round, equal and reactive to light bilaterally, conjunctivae normal bilaterally. EXTREMITIES: No cyanosis or edema. Feet examined, no abnormalities noted. Peripheral pulses weak in feet. Sensation intact in feet. White discoloration between toes. Ultrasound recommended for feet due to weak pulses. NEUROLOGICAL: Oriented to person, place and time, no gait abnormalities, moves all extremities without gross motor or sensory deficit.  Physical Exam Vitals and nursing note reviewed.  Constitutional:      Appearance: Normal appearance.  Cardiovascular:     Pulses:          Dorsalis pedis pulses are 1+ on the right side and 1+ on the left side.       Posterior tibial pulses are 1+ on the right side and 1+ on the left side.  Feet:     Right foot:     Protective Sensation: 10 sites tested.  10 sites sensed.     Skin integrity: Skin integrity normal.     Toenail Condition: Right toenails are normal.     Left foot:     Protective Sensation: 10 sites tested.  10 sites sensed.     Skin integrity: Skin integrity normal.     Toenail Condition: Left toenails are normal.  Neurological:     Mental Status: She is alert.          Assessment & Plan:    Assessment & Plan Type 2 diabetes mellitus Obesity Newly diagnosed with HbA1c of 6.5%, within target range. No medication needed as she is within age-appropriate goal Extensively discussed elevated BMI of 36 and its effect on blood sugars, she declines weight management referral at this time opting for diet/lifestyle changes. - Monitor HbA1c and 3 months.  If persistently well-controlled, would space out follow-ups every 6 months - Emphasized diet and lifestyle modifications, including reducing sugary and processed foods, and increasing physical activity. - Encouraged walking for 30 minutes, at least 3 times a week. - Provided dietary guidance, including portion control and  healthy plate composition. - Ordered urine sample to check for proteinuria. - Set up diabetic eye exam. - Consider referral to a nutritionist if needed.  Current A1c: 6.5 Med regimen: Diet and Lifestyle Urine microalbumin: Ordered Eye exam: Needs Foot exam: Done Ace-i/ARB: Yes Statin: Yes Influenza and Pneumococcal vaccines: UTD Neuropathy: No  Nephropathy: No  Retinopathy: No  Diet: Improving Weight concerns: Improving  Chronic cough, likely due to allergic rhinitis Chronic cough over the last few months, likely due to allergic rhinitis with post-nasal drip and runny nose. - Start Flonase  nasal spray, one spray daily in the morning. - Increase to two sprays if no improvement after 5 days.  Diminished pedal pulses Notably diminished pedal pulses BL, PAD is a possibility, ordering ultrasound - Ordered ultrasound of the feet to assess blood flow. - Referred to [facility] for further evaluation.  Essential hypertension Blood pressure 132/76 mmHg today well-controlled, no changes.  Recent CMP did show renal function and potassium WNL. - Continue Hyzaar 100-25 mg daily  Hyperlipidemia well-controlled, no changes. Currently on fluvastatin with no reported side effects. - Continue fluvastatin XL 80 mg daily as prescribed.     Return in about 3 months (around 08/27/2024) for Diabetes.  Damarien Nyman K Agapito Hanway, MD  05/29/24    Contains text generated by [employer].        [1]  Allergies Allergen Reactions   Atorvastatin  Other (See Comments)  [2]  Current Outpatient Medications on File Prior to Visit  Medication Sig Dispense Refill   amLODipine  (NORVASC ) 10 MG tablet Take 1 tablet (10 mg total) by mouth daily. 90 tablet 1   Aspirin-Salicylamide-Caffeine (BC HEADACHE POWDER PO) Take 1 Dose by mouth daily. Contains 100mg  of Aspirin     fluvastatin XL (LESCOL XL) 80 MG 24 hr tablet Take 1 tablet (80 mg total) by mouth daily. 90 tablet 0  losartan -hydrochlorothiazide  (HYZAAR) 100-25 MG tablet Take 1 tablet by mouth daily. 90 tablet 3   Multiple Vitamin (MULTIVITAMIN) tablet Take 1 tablet by mouth daily.     spironolactone  (ALDACTONE ) 25 MG tablet TAKE 1 TABLET (25 MG TOTAL) BY MOUTH DAILY. 90 tablet 3   No current facility-administered medications on file prior to visit.   "

## 2024-05-29 NOTE — Patient Instructions (Addendum)
 Thank you for visiting [facility] today! Here's what we talked about: - Set up a diabetic eye exam  - START Flonase nasal spray daily for cough - They will call you for ultrasound

## 2024-06-07 ENCOUNTER — Ambulatory Visit: Payer: Self-pay

## 2024-08-27 ENCOUNTER — Ambulatory Visit

## 2024-11-26 ENCOUNTER — Other Ambulatory Visit

## 2024-12-03 ENCOUNTER — Encounter

## 2025-01-05 ENCOUNTER — Ambulatory Visit
